# Patient Record
Sex: Female | Born: 1995 | Race: Black or African American | Hispanic: No | Marital: Single | Smoking: Former smoker
Health system: Southern US, Community
[De-identification: ages and names within clinical notes are randomized; demographics above are authoritative.]

## PROBLEM LIST (undated history)

## (undated) ENCOUNTER — Inpatient Hospital Stay (HOSPITAL_COMMUNITY): Payer: Self-pay

## (undated) DIAGNOSIS — D649 Anemia, unspecified: Secondary | ICD-10-CM

## (undated) DIAGNOSIS — D509 Iron deficiency anemia, unspecified: Secondary | ICD-10-CM

## (undated) DIAGNOSIS — Z8619 Personal history of other infectious and parasitic diseases: Secondary | ICD-10-CM

## (undated) DIAGNOSIS — R519 Headache, unspecified: Secondary | ICD-10-CM

## (undated) DIAGNOSIS — K08409 Partial loss of teeth, unspecified cause, unspecified class: Secondary | ICD-10-CM

## (undated) DIAGNOSIS — D62 Acute posthemorrhagic anemia: Secondary | ICD-10-CM

## (undated) DIAGNOSIS — Z87891 Personal history of nicotine dependence: Secondary | ICD-10-CM

## (undated) DIAGNOSIS — R51 Headache: Secondary | ICD-10-CM

## (undated) HISTORY — PX: WISDOM TOOTH EXTRACTION: SHX21

---
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
## 2013-08-03 ENCOUNTER — Emergency Department (HOSPITAL_COMMUNITY)
Admission: EM | Admit: 2013-08-03 | Discharge: 2013-08-03 | Disposition: A | Discharge status: Home / Self Care | Payer: Medicaid - Out of State | Attending: Emergency Medicine | Admitting: Emergency Medicine

## 2013-08-03 ENCOUNTER — Encounter (HOSPITAL_COMMUNITY): Payer: Self-pay | Admitting: Emergency Medicine

## 2013-08-03 ENCOUNTER — Emergency Department (HOSPITAL_COMMUNITY): Payer: Medicaid - Out of State

## 2013-08-03 DIAGNOSIS — N739 Female pelvic inflammatory disease, unspecified: Secondary | ICD-10-CM

## 2013-08-03 DIAGNOSIS — Z3202 Encounter for pregnancy test, result negative: Secondary | ICD-10-CM | POA: Insufficient documentation

## 2013-08-03 DIAGNOSIS — Z791 Long term (current) use of non-steroidal anti-inflammatories (NSAID): Secondary | ICD-10-CM | POA: Insufficient documentation

## 2013-08-03 LAB — URINALYSIS, ROUTINE W REFLEX MICROSCOPIC
Glucose, UA: NEGATIVE mg/dL
Hgb urine dipstick: NEGATIVE
Protein, ur: NEGATIVE mg/dL
Specific Gravity, Urine: 1.024 (ref 1.005–1.030)
pH: 7 (ref 5.0–8.0)

## 2013-08-03 LAB — CBC WITH DIFFERENTIAL/PLATELET
Basophils Absolute: 0 10*3/uL (ref 0.0–0.1)
Basophils Relative: 1 % (ref 0–1)
Eosinophils Absolute: 0 10*3/uL (ref 0.0–0.7)
Eosinophils Relative: 1 % (ref 0–5)
HCT: 36.8 % (ref 36.0–46.0)
Hemoglobin: 11.9 g/dL — ABNORMAL LOW (ref 12.0–15.0)
MCH: 28.9 pg (ref 26.0–34.0)
MCHC: 32.3 g/dL (ref 30.0–36.0)
Monocytes Relative: 7 % (ref 3–12)
Neutro Abs: 2 10*3/uL (ref 1.7–7.7)
Neutrophils Relative %: 43 % (ref 43–77)
RDW: 13.2 % (ref 11.5–15.5)
WBC: 4.6 10*3/uL (ref 4.0–10.5)

## 2013-08-03 LAB — COMPREHENSIVE METABOLIC PANEL
AST: 18 U/L (ref 0–37)
Albumin: 3.9 g/dL (ref 3.5–5.2)
Chloride: 104 mEq/L (ref 96–112)
Creatinine, Ser: 0.6 mg/dL (ref 0.50–1.10)
GFR calc Af Amer: >90 mL/min (ref >90)
Glucose, Bld: 115 mg/dL — ABNORMAL HIGH (ref 70–99)
Potassium: 4 mEq/L (ref 3.5–5.1)
Total Bilirubin: 0.6 mg/dL (ref 0.3–1.2)
Total Protein: 7.4 g/dL (ref 6.0–8.3)

## 2013-08-03 LAB — WET PREP, GENITAL
Trich, Wet Prep: NONE SEEN
Yeast Wet Prep HPF POC: NONE SEEN

## 2013-08-03 LAB — POCT PREGNANCY, URINE: Preg Test, Ur: NEGATIVE

## 2013-08-03 MED ORDER — DOXYCYCLINE HYCLATE 100 MG PO TABS
100.0000 mg | ORAL_TABLET | Freq: Once | ORAL | Status: AC
  Administered 2013-08-03: 100 mg via ORAL
  Filled 2013-08-03: qty 1

## 2013-08-03 MED ORDER — METRONIDAZOLE 500 MG PO TABS: 500.0000 mg | ORAL_TABLET | Freq: Two times a day (BID) | ORAL | Status: DC

## 2013-08-03 MED ORDER — CEFTRIAXONE SODIUM 250 MG IJ SOLR
250.0000 mg | Freq: Once | INTRAMUSCULAR | Status: AC
  Administered 2013-08-03: 250 mg via INTRAMUSCULAR
  Filled 2013-08-03: qty 250

## 2013-08-03 MED ORDER — DOXYCYCLINE HYCLATE 100 MG PO CAPS: 100.0000 mg | ORAL_CAPSULE | Freq: Two times a day (BID) | ORAL | Status: DC

## 2013-08-03 MED ORDER — ONDANSETRON 4 MG PO TBDP
4.0000 mg | ORAL_TABLET | Freq: Once | ORAL | Status: AC
  Administered 2013-08-03: 4 mg via ORAL
  Filled 2013-08-03: qty 1

## 2013-08-03 MED ORDER — BUTALBITAL-APAP-CAFFEINE 50-325-40 MG PO TABS: 1.0000 | ORAL_TABLET | ORAL | Status: DC | PRN

## 2013-08-03 MED ORDER — METRONIDAZOLE 500 MG PO TABS
500.0000 mg | ORAL_TABLET | Freq: Once | ORAL | Status: AC
  Administered 2013-08-03: 500 mg via ORAL
  Filled 2013-08-03: qty 1

## 2013-08-03 MED ORDER — IBUPROFEN 800 MG PO TABS
800.0000 mg | ORAL_TABLET | Freq: Once | ORAL | Status: AC
  Administered 2013-08-03: 800 mg via ORAL
  Filled 2013-08-03: qty 1

## 2013-08-03 NOTE — ED Provider Notes (Signed)
CSN: [id number]     Arrival date & time 08/03/13  1433 History   First MD Initiated Contact with Patient 08/03/13 1612     Chief Complaint  Patient presents with  . Pelvic Pain   (Consider location/radiation/quality/duration/timing/severity/associated sxs/prior Treatment) HPI Comments: 17 year old female presents with one week of waxing and waning lower abdominal pain. Is worst in the left lower quadrant. She states started after her most recent menstrual cycle. Should not take anything for the pain. She feels nauseous and vomited once last night. He has not had any urinary symptoms. She denied vaginal bleeding. She is having more white discharge. She is having protected sex with her boyfriend who she believes does not have an STD.   History reviewed. No pertinent past medical history. History reviewed. No pertinent past surgical history. No family history on file. History  Substance Use Topics  . Smoking status: Never Smoker   . Smokeless tobacco: Never Used  . Alcohol Use: Yes   OB History   Grav Para Term Preterm Abortions TAB SAB Ect Mult Living                 Review of Systems  Constitutional: Negative for fever.  Gastrointestinal: Positive for nausea.  Genitourinary: Positive for vaginal discharge. Negative for dysuria and menstrual problem.  All other systems reviewed and are negative.    Allergies  Review of patient's allergies indicates not on file.  Home Medications   Current Outpatient Rx  Name  Route  Sig  Dispense  Refill  . naproxen (NAPROSYN) 250 MG tablet   Oral   Take 250 mg by mouth 2 (two) times daily with a meal.          BP 126/75  Pulse 76  Temp(Src) 98.1 F (36.7 C) (Oral)  Resp 20  SpO2 100%  LMP 07/27/2013 Physical Exam  Nursing note and vitals reviewed. Constitutional: She is oriented to person, place, and time. She appears well-developed and well-nourished. No distress.  HENT:  Head: Normocephalic and atraumatic.  Right Ear:  External ear normal.  Left Ear: External ear normal.  Nose: Nose normal.  Eyes: Right eye exhibits no discharge. Left eye exhibits no discharge.  Cardiovascular: Normal rate, regular rhythm and normal heart sounds.   Pulmonary/Chest: Effort normal and breath sounds normal.  Abdominal: Soft. There is tenderness in the left lower quadrant.  Genitourinary: Uterus is tender. Cervix exhibits motion tenderness, discharge and friability.  Neurological: She is alert and oriented to person, place, and time.  Skin: Skin is warm and dry.    ED Course  Procedures (including critical care time) Labs Review Labs Reviewed  WET PREP, GENITAL - Abnormal; Notable for the following:    Clue Cells Wet Prep HPF POC FEW (*)    WBC, Wet Prep HPF POC FEW (*)    All other components within normal limits  CBC WITH DIFFERENTIAL - Abnormal; Notable for the following:    Hemoglobin 11.9 (*)    Lymphocytes Relative 48 (*)    All other components within normal limits  COMPREHENSIVE METABOLIC PANEL - Abnormal; Notable for the following:    Glucose, Bld 115 (*)    All other components within normal limits  URINALYSIS, ROUTINE W REFLEX MICROSCOPIC - Abnormal; Notable for the following:    APPearance CLOUDY (*)    All other components within normal limits  GC/CHLAMYDIA PROBE AMP  POCT PREGNANCY, URINE   Imaging Review US Pelvis Complete  08/03/2013   CLINICAL DATA:  Left lower quadrant pain.  EXAM: TRANSABDOMINAL ULTRASOUND OF PELVIS  DOPPLER ULTRASOUND OF OVARIES  TECHNIQUE: Transabdominal ultrasound examination of the pelvis was performed including evaluation of the uterus, ovaries, adnexal regions, and pelvic cul-de-sac.  Color and duplex Doppler ultrasound was utilized to evaluate blood flow to the ovaries.  COMPARISON:  None.  FINDINGS: Uterus  Measurements: 7.9 x 4.4 x 5.8 cm. No fibroids or other mass visualized.  Endometrium  Thickness: 2.3 mm. In the fundus, there is a somewhat heterogeneous endometrial  mass measuring 23 x 32 x 39 mm, with internal flow signal on power Doppler.  Right ovary  Measurements: 43 x 24 x 20 mm. Normal color Doppler appearance with arterial and venous waveforms recorded. Normal appearance/no adnexal mass.  Left ovary  Measurements: 43 x 33 x 23 mm. Normal color Doppler appearance, with arterial and venous signals identified. Normal appearance/no adnexal mass.  Pulsed Doppler evaluation demonstrates normal low-resistance arterial and venous waveforms in both ovaries.  There is moderate pelvic fluid with low level internal echoes.  IMPRESSION: 1. 3.9 cm endometrial lesion in the uterine fundus. Gynecologic followup recommended. 2. No evidence of ovarian torsion. 3. Moderate pelvic fluid with low level internal echoes which may represent debris or blood.   Electronically Signed   By: Oley Balm M.D.   On: 08/03/2013 20:56   [place] Art/ven Flow Abd Pelv Doppler  08/03/2013   CLINICAL DATA:  Left lower quadrant pain.  EXAM: TRANSABDOMINAL ULTRASOUND OF PELVIS  DOPPLER ULTRASOUND OF OVARIES  TECHNIQUE: Transabdominal ultrasound examination of the pelvis was performed including evaluation of the uterus, ovaries, adnexal regions, and pelvic cul-de-sac.  Color and duplex Doppler ultrasound was utilized to evaluate blood flow to the ovaries.  COMPARISON:  None.  FINDINGS: Uterus  Measurements: 7.9 x 4.4 x 5.8 cm. No fibroids or other mass visualized.  Endometrium  Thickness: 2.3 mm. In the fundus, there is a somewhat heterogeneous endometrial mass measuring 23 x 32 x 39 mm, with internal flow signal on power Doppler.  Right ovary  Measurements: 43 x 24 x 20 mm. Normal color Doppler appearance with arterial and venous waveforms recorded. Normal appearance/no adnexal mass.  Left ovary  Measurements: 43 x 33 x 23 mm. Normal color Doppler appearance, with arterial and venous signals identified. Normal appearance/no adnexal mass.  Pulsed Doppler evaluation demonstrates normal low-resistance  arterial and venous waveforms in both ovaries.  There is moderate pelvic fluid with low level internal echoes.  IMPRESSION: 1. 3.9 cm endometrial lesion in the uterine fundus. Gynecologic followup recommended. 2. No evidence of ovarian torsion. 3. Moderate pelvic fluid with low level internal echoes which may represent debris or blood.   Electronically Signed   By: Oley Balm M.D.   On: 08/03/2013 20:56    EKG Interpretation   None       MDM   1. Pelvic inflammatory disease    She has a nonsurgical abdomen. She is otherwise comfortable and her pain nearly resolved with ibuprofen. He had significant cervical motion tenderness friable cervix with discharge. Do this and her ultrasound having some fluid we will treat for PID. She was given IM Rocephin and will treat with Flagyl and doxycycline outpatient. She was notified of the endometrial lesion and I recommended she followup with a gynecologist as an outpatient. I discussed return precautions with the patient.   Audree Camel, MD 08/03/13 [phone]

## 2013-08-03 NOTE — ED Notes (Signed)
Pt in restroom at this time in attempt to provide urine sample.

## 2013-08-03 NOTE — ED Notes (Signed)
Female RN present during pelvic ultrasound.

## 2013-08-03 NOTE — ED Notes (Signed)
Pt states started having pelvic pain when her menstrual cycle ended about a week ago, states was worse last night where she couldn't sleep, states cramping feeling like she's on her period but is not bleeding, denies urinary symptoms, states feels nauseous.

## 2013-08-04 LAB — GC/CHLAMYDIA PROBE AMP
CT Probe RNA: NEGATIVE
GC Probe RNA: NEGATIVE

## 2014-05-15 ENCOUNTER — Encounter (HOSPITAL_COMMUNITY): Payer: Self-pay | Admitting: Emergency Medicine

## 2014-05-15 ENCOUNTER — Emergency Department (HOSPITAL_COMMUNITY)
Admission: EM | Admit: 2014-05-15 | Discharge: 2014-05-15 | Disposition: A | Discharge status: Home / Self Care | Payer: Medicaid - Out of State | Attending: Emergency Medicine | Admitting: Emergency Medicine

## 2014-05-15 DIAGNOSIS — N938 Other specified abnormal uterine and vaginal bleeding: Secondary | ICD-10-CM | POA: Insufficient documentation

## 2014-05-15 DIAGNOSIS — Z3202 Encounter for pregnancy test, result negative: Secondary | ICD-10-CM | POA: Insufficient documentation

## 2014-05-15 DIAGNOSIS — N949 Unspecified condition associated with female genital organs and menstrual cycle: Secondary | ICD-10-CM | POA: Diagnosis not present

## 2014-05-15 DIAGNOSIS — N898 Other specified noninflammatory disorders of vagina: Secondary | ICD-10-CM | POA: Insufficient documentation

## 2014-05-15 DIAGNOSIS — F17200 Nicotine dependence, unspecified, uncomplicated: Secondary | ICD-10-CM | POA: Insufficient documentation

## 2014-05-15 LAB — URINALYSIS, ROUTINE W REFLEX MICROSCOPIC
Bilirubin Urine: NEGATIVE
Glucose, UA: NEGATIVE mg/dL
Ketones, ur: NEGATIVE mg/dL
Leukocytes, UA: NEGATIVE
Nitrite: NEGATIVE
Protein, ur: NEGATIVE mg/dL
Specific Gravity, Urine: 1.025 (ref 1.005–1.030)
Urobilinogen, UA: 0.2 mg/dL (ref 0.0–1.0)
pH: 5.5 (ref 5.0–8.0)

## 2014-05-15 LAB — WET PREP, GENITAL
Trich, Wet Prep: NONE SEEN
Yeast Wet Prep HPF POC: NONE SEEN

## 2014-05-15 LAB — URINE MICROSCOPIC-ADD ON

## 2014-05-15 LAB — PREGNANCY, URINE: Preg Test, Ur: NEGATIVE

## 2014-05-15 NOTE — ED Provider Notes (Signed)
CSN: [id number]     [id number] date & time 05/15/14  0913 History   First MD Initiated Contact with Patient 05/15/14 0935     Chief Complaint  Patient presents with  . Vaginal Bleeding      HPI Patient reports abnormal vaginal bleeding.  Her last normal menstrual period was July 22.  She's having new bleeding which is abnormal as well some lower abdominal cramping.  Small amount of vaginal discharge.  She was lightheaded last week but is no longer lightheaded.  She denies pain at this time.  No fevers or chills.  No other complaints   History reviewed. No pertinent past medical history. History reviewed. No pertinent past surgical history. No family history on file. History  Substance Use Topics  . Smoking status: Current Every Day Smoker    Types: Cigars  . Smokeless tobacco: Never Used  . Alcohol Use: Yes   OB History   Grav Para Term Preterm Abortions TAB SAB Ect Mult Living                 Review of Systems  All other systems reviewed and are negative.     Allergies  Review of patient's allergies indicates no known allergies.  Home Medications   Prior to Admission medications   Medication Sig Start Date End Date Taking? Authorizing Provider  naproxen (NAPROSYN) 250 MG tablet Take 500 mg by mouth daily as needed for mild pain (and for cramping).    Yes Historical Provider, MD   BP 105/66  Temp(Src) 98.8 F (37.1 C) (Oral)  Resp 17  Ht 5\' 4"  (1.626 m)  Wt 115 lb (52.164 kg)  BMI 19.73 kg/m2  SpO2 99%  LMP 04/26/2014 Physical Exam  Nursing note and vitals reviewed. Constitutional: She is oriented to person, place, and time. She appears well-developed and well-nourished. No distress.  HENT:  Head: Normocephalic and atraumatic.  Eyes: EOM are normal.  Neck: Normal range of motion.  Cardiovascular: Normal rate, regular rhythm and normal heart sounds.   Pulmonary/Chest: Effort normal and breath sounds normal.  Abdominal: Soft. She exhibits no distension. There  is no tenderness.  Genitourinary:  Normal external genitalia.  Normal cervix.  No cervical discharge noted.  No significant vaginal discharge.  No vaginal bleeding noted.  No cervical motion tenderness.  No adnexal masses or fullness  Musculoskeletal: Normal range of motion.  Neurological: She is alert and oriented to person, place, and time.  Skin: Skin is warm and dry.  Psychiatric: She has a normal mood and affect. Judgment normal.    ED Course  Procedures (including critical care time) Labs Review Labs Reviewed  WET PREP, GENITAL - Abnormal; Notable for the following:    Clue Cells Wet Prep HPF POC FEW (*)    WBC, Wet Prep HPF POC FEW (*)    All other components within normal limits  URINALYSIS, ROUTINE W REFLEX MICROSCOPIC - Abnormal; Notable for the following:    APPearance CLOUDY (*)    Hgb urine dipstick TRACE (*)    All other components within normal limits  GC/CHLAMYDIA PROBE AMP  PREGNANCY, URINE  URINE MICROSCOPIC-ADD ON    Imaging Review No results found.   EKG Interpretation None      MDM   Final diagnoses:  Dysfunctional uterine bleeding    Dysfunctional uterine bleeding.  Urine pregnancy negative.  Discharge home.    Lyanne CoKevin M Montgomery Favor, MD 05/15/14 1146

## 2014-05-15 NOTE — ED Notes (Signed)
Pt. Is not able to use the restroom at this time, but is aware that we need urine specimen.

## 2014-05-15 NOTE — ED Notes (Signed)
Pt states that last week she would get lightheaded and feel syncopal with pelvic pain. Then this morning she woke up with vaginal bleeding in the bed. Pt states that pain is darker red. Pt states that normally her periods she has really bad cramping but denies any pain at this time.  Pt states that she did have intercourse last night.

## 2014-05-16 LAB — GC/CHLAMYDIA PROBE AMP
CT Probe RNA: NEGATIVE
GC Probe RNA: NEGATIVE

## 2014-06-12 ENCOUNTER — Encounter (HOSPITAL_COMMUNITY): Payer: Self-pay | Admitting: Emergency Medicine

## 2014-06-12 ENCOUNTER — Emergency Department (HOSPITAL_COMMUNITY)
Admission: EM | Admit: 2014-06-12 | Discharge: 2014-06-12 | Disposition: A | Discharge status: Home / Self Care | Payer: Medicaid - Out of State | Attending: Emergency Medicine | Admitting: Emergency Medicine

## 2014-06-12 DIAGNOSIS — M542 Cervicalgia: Secondary | ICD-10-CM | POA: Insufficient documentation

## 2014-06-12 DIAGNOSIS — Z791 Long term (current) use of non-steroidal anti-inflammatories (NSAID): Secondary | ICD-10-CM | POA: Diagnosis not present

## 2014-06-12 DIAGNOSIS — M436 Torticollis: Secondary | ICD-10-CM | POA: Insufficient documentation

## 2014-06-12 DIAGNOSIS — M62838 Other muscle spasm: Secondary | ICD-10-CM | POA: Insufficient documentation

## 2014-06-12 DIAGNOSIS — F17200 Nicotine dependence, unspecified, uncomplicated: Secondary | ICD-10-CM | POA: Diagnosis not present

## 2014-06-12 DIAGNOSIS — Z79899 Other long term (current) drug therapy: Secondary | ICD-10-CM | POA: Insufficient documentation

## 2014-06-12 MED ORDER — LORAZEPAM 2 MG/ML IJ SOLN: 1.0000 mg | Freq: Once | INTRAMUSCULAR | Status: DC

## 2014-06-12 MED ORDER — NAPROXEN 500 MG PO TABS
500.0000 mg | ORAL_TABLET | Freq: Once | ORAL | Status: AC
  Administered 2014-06-12: 500 mg via ORAL
  Filled 2014-06-12: qty 1

## 2014-06-12 MED ORDER — LORAZEPAM 1 MG PO TABS: 0.5000 mg | ORAL_TABLET | Freq: Two times a day (BID) | ORAL | Status: DC | PRN

## 2014-06-12 MED ORDER — NAPROXEN 500 MG PO TABS: 500.0000 mg | ORAL_TABLET | Freq: Two times a day (BID) | ORAL | Status: DC

## 2014-06-12 MED ORDER — LORAZEPAM 1 MG PO TABS
1.0000 mg | ORAL_TABLET | Freq: Once | ORAL | Status: AC
  Administered 2014-06-12: 1 mg via ORAL
  Filled 2014-06-12: qty 1

## 2014-06-12 NOTE — ED Notes (Signed)
Pt reports neck pain x2 days that was better this am before she starting blow drying her hear. She says "I can't move my head, my neck hurts too bad". Also reports right shoulder pain that is radiating from her neck. Denies fall or injury or other symptoms.

## 2014-06-12 NOTE — Discharge Instructions (Signed)

## 2014-06-13 NOTE — ED Provider Notes (Signed)
CSN: [id number]     Arrival date & time 06/12/14  4098 History   First MD Initiated Contact with Patient 06/12/14 1024     Chief Complaint  Patient presents with  . Neck Pain     (Consider location/radiation/quality/duration/timing/severity/associated sxs/prior Treatment) HPI   Lisa Hester is a 18 y.o. female who complains of  neck pain beginning this morning. The pain is positional with movement of neck without radiation of pain down the arms. Mechanism of injury: patient turned her head rapidly, had sudden spasm, and has been unable to turn her head to the right.  Symptoms have been acute since that time. Prior history of neck problems: no prior neck problems. There is no numbness, tingling, weakness in the arms. Denies unilateral weakness, facial asymmetry, difficulty with speech, change in gait, or vertigo.     History reviewed. No pertinent past medical history. History reviewed. No pertinent past surgical history. No family history on file. History  Substance Use Topics  . Smoking status: Current Every Day Smoker    Types: Cigars  . Smokeless tobacco: Never Used  . Alcohol Use: Yes   OB History   Grav Para Term Preterm Abortions TAB SAB Ect Mult Living                 Review of Systems  Ten systems reviewed and are negative for acute change, except as noted in the HPI.    Allergies  Review of patient's allergies indicates no known allergies.  Home Medications   Prior to Admission medications   Medication Sig Start Date End Date Taking? Authorizing Provider  methocarbamol (ROBAXIN) 500 MG tablet Take 500 mg by mouth 2 (two) times daily.   Yes Historical Provider, MD  LORazepam (ATIVAN) 1 MG tablet Take 0.5 tablets (0.5 mg total) by mouth 2 (two) times daily as needed (for muscle spasm). 06/12/14   Arthor Captain, PA-C  naproxen (NAPROSYN) 500 MG tablet Take 1 tablet (500 mg total) by mouth 2 (two) times daily with a meal. 06/12/14   Clarine Elrod, PA-C   BP 105/80   Pulse 90  Temp(Src) 99.1 F (37.3 C) (Oral)  Resp 18  Wt 115 lb (52.164 kg)  SpO2 100%  LMP 05/27/2014 Physical Exam  Constitutional: She is oriented to person, place, and time. She appears well-developed and well-nourished. No distress.  HENT:  Head: Normocephalic and atraumatic.  Eyes: Conjunctivae are normal. No scleral icterus.  Neck: Normal range of motion.  Cardiovascular: Normal rate, regular rhythm and normal heart sounds.  Exam reveals no gallop and no friction rub.   No murmur heard. Pulmonary/Chest: Effort normal and breath sounds normal. No respiratory distress.  Abdominal: Soft. Bowel sounds are normal. She exhibits no distension and no mass. There is no tenderness. There is no guarding.  Musculoskeletal:  Patient with pain and spasm on the right side. She is exquisitely TTP on the Right in the occiput, R trapezius. She is guarding and unable to turn her head past midline to the R. No weakness in the extremties  Neurological: She is alert and oriented to person, place, and time.  Skin: Skin is warm and dry. She is not diaphoretic.    ED Course  Procedures (including critical care time) Labs Review Labs Reviewed - No data to display  Imaging Review No results found.   EKG Interpretation None      MDM   Final diagnoses:  Neck muscle spasm   Acute torticollis of the neck. She will  be given pain meds. encourage  Supportive care with moist heat applications.  The patient appears reasonably screened and/or stabilized for discharge and I doubt any other medical condition or other [facility] requiring further screening, evaluation, or treatment in the ED at this time prior to discharge.     Arthor Captain, PA-C 06/16/14 2013

## 2014-06-16 NOTE — ED Provider Notes (Signed)
Medical screening examination/treatment/procedure(s) were performed by non-physician practitioner and as supervising physician I was immediately available for consultation/collaboration.   EKG Interpretation None      Devoria Albe, MD, Armando Gang   Ward Givens, MD 06/16/14 2053

## 2014-09-18 ENCOUNTER — Encounter: Payer: Self-pay | Admitting: *Deleted

## 2014-09-18 ENCOUNTER — Ambulatory Visit (INDEPENDENT_AMBULATORY_CARE_PROVIDER_SITE_OTHER): Payer: Medicaid Other | Admitting: *Deleted

## 2014-09-18 ENCOUNTER — Other Ambulatory Visit: Payer: Self-pay | Admitting: Obstetrics and Gynecology

## 2014-09-18 VITALS — BP 103/78 | HR 91 | Temp 98.6°F | Ht 64.0 in | Wt 114.9 lb

## 2014-09-18 DIAGNOSIS — Z3491 Encounter for supervision of normal pregnancy, unspecified, first trimester: Secondary | ICD-10-CM

## 2014-09-18 DIAGNOSIS — N926 Irregular menstruation, unspecified: Secondary | ICD-10-CM

## 2014-09-18 LAB — POCT PREGNANCY, URINE: Preg Test, Ur: POSITIVE — AB

## 2014-09-18 NOTE — Progress Notes (Signed)
Pregnancy test: Positive, viability scan ordered/scheduled on 10/09/14 @ 115 pm. Prenatal labs done, teaching information given. Pregnancy verification letter given.

## 2014-09-19 LAB — PRENATAL PROFILE (SOLSTAS)
ANTIBODY SCREEN: NEGATIVE
Basophils Absolute: 0 10*3/uL (ref 0.0–0.1)
Basophils Relative: 0 % (ref 0–1)
EOS ABS: 0 10*3/uL (ref 0.0–0.7)
EOS PCT: 1 % (ref 0–5)
HCT: 34.1 % — ABNORMAL LOW (ref 36.0–46.0)
HIV 1&2 Ab, 4th Generation: NONREACTIVE
Hemoglobin: 11.1 g/dL — ABNORMAL LOW (ref 12.0–15.0)
Hepatitis B Surface Ag: NEGATIVE
LYMPHS PCT: 50 % — AB (ref 12–46)
Lymphs Abs: 2.4 10*3/uL (ref 0.7–4.0)
MCH: 27.7 pg (ref 26.0–34.0)
MCHC: 32.6 g/dL (ref 30.0–36.0)
MCV: 85 fL (ref 78.0–100.0)
MPV: 9.5 fL (ref 9.4–12.4)
Monocytes Absolute: 0.3 10*3/uL (ref 0.1–1.0)
Monocytes Relative: 7 % (ref 3–12)
Neutro Abs: 2 10*3/uL (ref 1.7–7.7)
Neutrophils Relative %: 42 % — ABNORMAL LOW (ref 43–77)
Platelets: 351 10*3/uL (ref 150–400)
RBC: 4.01 MIL/uL (ref 3.87–5.11)
RDW: 15.9 % — ABNORMAL HIGH (ref 11.5–15.5)
Rh Type: POSITIVE
Rubella: 2.67 Index — ABNORMAL HIGH (ref <0.90)
WBC: 4.7 10*3/uL (ref 4.0–10.5)

## 2014-09-19 LAB — GC/CHLAMYDIA PROBE AMP
CT Probe RNA: NEGATIVE
GC Probe RNA: NEGATIVE

## 2014-09-20 LAB — HEMOGLOBINOPATHY EVALUATION
Hemoglobin Other: 0 %
Hgb A2 Quant: 2.6 % (ref 2.2–3.2)
Hgb A: 97.4 % (ref 96.8–97.8)
Hgb F Quant: 0 % (ref 0.0–2.0)
Hgb S Quant: 0 %

## 2014-09-20 LAB — CULTURE, OB URINE
COLONY COUNT: NO GROWTH
Organism ID, Bacteria: NO GROWTH

## 2014-09-22 LAB — CANNABANOIDS (GC/LC/MS), URINE: THC-COOH UR CONFIRM: 174 ng/mL — AB (ref <5)

## 2014-09-25 ENCOUNTER — Emergency Department (HOSPITAL_COMMUNITY): Payer: No Typology Code available for payment source

## 2014-09-25 ENCOUNTER — Emergency Department (HOSPITAL_COMMUNITY)
Admission: EM | Admit: 2014-09-25 | Discharge: 2014-09-25 | Disposition: A | Discharge status: Home / Self Care | Payer: No Typology Code available for payment source | Attending: Emergency Medicine | Admitting: Emergency Medicine

## 2014-09-25 DIAGNOSIS — Y92410 Unspecified street and highway as the place of occurrence of the external cause: Secondary | ICD-10-CM | POA: Diagnosis not present

## 2014-09-25 DIAGNOSIS — S3991XA Unspecified injury of abdomen, initial encounter: Secondary | ICD-10-CM | POA: Insufficient documentation

## 2014-09-25 DIAGNOSIS — R109 Unspecified abdominal pain: Secondary | ICD-10-CM

## 2014-09-25 DIAGNOSIS — Y9389 Activity, other specified: Secondary | ICD-10-CM | POA: Diagnosis not present

## 2014-09-25 DIAGNOSIS — O9A211 Injury, poisoning and certain other consequences of external causes complicating pregnancy, first trimester: Secondary | ICD-10-CM | POA: Insufficient documentation

## 2014-09-25 DIAGNOSIS — R110 Nausea: Secondary | ICD-10-CM | POA: Insufficient documentation

## 2014-09-25 DIAGNOSIS — S3992XA Unspecified injury of lower back, initial encounter: Secondary | ICD-10-CM | POA: Insufficient documentation

## 2014-09-25 DIAGNOSIS — Z87891 Personal history of nicotine dependence: Secondary | ICD-10-CM | POA: Insufficient documentation

## 2014-09-25 DIAGNOSIS — Y998 Other external cause status: Secondary | ICD-10-CM | POA: Insufficient documentation

## 2014-09-25 DIAGNOSIS — O26899 Other specified pregnancy related conditions, unspecified trimester: Secondary | ICD-10-CM

## 2014-09-25 DIAGNOSIS — O9989 Other specified diseases and conditions complicating pregnancy, childbirth and the puerperium: Secondary | ICD-10-CM | POA: Insufficient documentation

## 2014-09-25 DIAGNOSIS — Z3A01 Less than 8 weeks gestation of pregnancy: Secondary | ICD-10-CM | POA: Insufficient documentation

## 2014-09-25 LAB — PRESCRIPTION MONITORING PROFILE (19 PANEL)
Amphetamine/Meth: NEGATIVE ng/mL
BUPRENORPHINE, URINE: NEGATIVE ng/mL
Barbiturate Screen, Urine: NEGATIVE ng/mL
Benzodiazepine Screen, Urine: NEGATIVE ng/mL
CARISOPRODOL, URINE: NEGATIVE ng/mL
Cocaine Metabolites: NEGATIVE ng/mL
Creatinine, Urine: 234.04 mg/dL (ref >20.0)
ECSTASY: NEGATIVE ng/mL
Fentanyl, Ur: NEGATIVE ng/mL
MEPERIDINE UR: NEGATIVE ng/mL
METHADONE SCREEN, URINE: NEGATIVE ng/mL
METHAQUALONE SCREEN (URINE): NEGATIVE ng/mL
NITRITES URINE, INITIAL: NEGATIVE ug/mL
OXYCODONE SCRN UR: NEGATIVE ng/mL
Opiate Screen, Urine: NEGATIVE ng/mL
PH URINE, INITIAL: 5.8 pH (ref 4.5–8.9)
PROPOXYPHENE: NEGATIVE ng/mL
Phencyclidine, Ur: NEGATIVE ng/mL
TRAMADOL UR: NEGATIVE ng/mL
Tapentadol, urine: NEGATIVE ng/mL
Zolpidem, Urine: NEGATIVE ng/mL

## 2014-09-25 LAB — URINALYSIS, ROUTINE W REFLEX MICROSCOPIC
BILIRUBIN URINE: NEGATIVE
Glucose, UA: NEGATIVE mg/dL
Hgb urine dipstick: NEGATIVE
Ketones, ur: NEGATIVE mg/dL
Leukocytes, UA: NEGATIVE
Nitrite: NEGATIVE
PH: 6 (ref 5.0–8.0)
Protein, ur: NEGATIVE mg/dL
SPECIFIC GRAVITY, URINE: 1.026 (ref 1.005–1.030)
Urobilinogen, UA: 1 mg/dL (ref 0.0–1.0)

## 2014-09-25 LAB — HCG, QUANTITATIVE, PREGNANCY: hCG, Beta Chain, Quant, S: 17562 m[IU]/mL — ABNORMAL HIGH (ref <5)

## 2014-09-25 NOTE — Discharge Instructions (Signed)
Read the information below.  You may return to the Emergency Department at any time for worsening condition or any new symptoms that concern you.  If you develop worsening abdominal pain, uncontrolled bleeding contact your ObGyn or [facility].    Abdominal Pain During Pregnancy Abdominal pain is common in pregnancy. Most of the time, it does not cause harm. There are many causes of abdominal pain. Some causes are more serious than others. Some of the causes of abdominal pain in pregnancy are easily diagnosed. Occasionally, the diagnosis takes time to understand. Other times, the cause is not determined. Abdominal pain can be a sign that something is very wrong with the pregnancy, or the pain may have nothing to do with the pregnancy at all. For this reason, always tell your health care provider if you have any abdominal discomfort. HOME CARE INSTRUCTIONS  Monitor your abdominal pain for any changes. The following actions may help to alleviate any discomfort you are experiencing:  Do not have sexual intercourse or put anything in your vagina until your symptoms go away completely.  Get plenty of rest until your pain improves.  Drink clear fluids if you feel nauseous. Avoid solid food as long as you are uncomfortable or nauseous.  Only take over-the-counter or prescription medicine as directed by your health care provider.  Keep all follow-up appointments with your health care provider. SEEK IMMEDIATE MEDICAL CARE IF:  You are bleeding, leaking fluid, or passing tissue from the vagina.  You have increasing pain or cramping.  You have persistent vomiting.  You have painful or bloody urination.  You have a fever.  You notice a decrease in your baby's movements.  You have extreme weakness or feel faint.  You have shortness of breath, with or without abdominal pain.  You develop a severe headache with abdominal pain.  You have abnormal vaginal  discharge with abdominal pain.  You have persistent diarrhea.  You have abdominal pain that continues even after rest, or gets worse. MAKE SURE YOU:   Understand these instructions.  Will watch your condition.  Will get help right away if you are not doing well or get worse. Document Released: 09/21/2005 Document Revised: 07/12/2013 Document Reviewed: 04/20/2013 [facility] Patient Information 2015 [place], [place]. This information is not intended to replace advice given to you by your health care provider. Make sure you discuss any questions you have with your health care provider.
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
## 2014-10-05 NOTE — L&D Delivery Note (Signed)
Delivery Note At 1:21 PM a viable and healthy female was delivered via Vaginal, Spontaneous Delivery (Presentation: ; Occiput Anterior).  APGAR: 8, 9; weight pending .   Placenta status: Intact, Spontaneous.  Cord: 3 vessels with the following complications: None.  Cord pH: na. Mild shoulder dystocia relieved by episiotomy and McRoberts Maneuver.   Anesthesia: Epidural  Episiotomy: Median due to mild shoulder dystocia Lacerations: periclitoral  Suture Repair: 2.0 3.0 vicryl rapide Est. Blood Loss (mL):  300  Mom to postpartum.  Baby to Couplet care / Skin to Skin.  Jamariah Tony J 05/06/2015, 1:43 PM

## 2014-10-09 ENCOUNTER — Ambulatory Visit (HOSPITAL_COMMUNITY)
Admission: RE | Admit: 2014-10-09 | Discharge: 2014-10-09 | Disposition: A | Discharge status: Home / Self Care | Payer: Medicaid Other | Source: Ambulatory Visit | Attending: Obstetrics and Gynecology | Admitting: Obstetrics and Gynecology

## 2014-10-09 DIAGNOSIS — Z3A01 Less than 8 weeks gestation of pregnancy: Secondary | ICD-10-CM | POA: Insufficient documentation

## 2014-10-09 DIAGNOSIS — N8320 Unspecified ovarian cysts: Secondary | ICD-10-CM | POA: Insufficient documentation

## 2014-10-09 DIAGNOSIS — O3481 Maternal care for other abnormalities of pelvic organs, first trimester: Secondary | ICD-10-CM | POA: Insufficient documentation

## 2014-10-09 DIAGNOSIS — Z36 Encounter for antenatal screening of mother: Secondary | ICD-10-CM | POA: Insufficient documentation

## 2014-10-09 DIAGNOSIS — N926 Irregular menstruation, unspecified: Secondary | ICD-10-CM

## 2014-10-11 ENCOUNTER — Encounter: Payer: Medicaid - Out of State | Admitting: Obstetrics and Gynecology

## 2014-10-25 ENCOUNTER — Ambulatory Visit (INDEPENDENT_AMBULATORY_CARE_PROVIDER_SITE_OTHER): Payer: Medicaid Other | Admitting: Physician Assistant

## 2014-10-25 ENCOUNTER — Telehealth: Payer: Self-pay | Admitting: *Deleted

## 2014-10-25 ENCOUNTER — Encounter: Payer: Self-pay | Admitting: Physician Assistant

## 2014-10-25 ENCOUNTER — Other Ambulatory Visit: Payer: Self-pay | Admitting: Physician Assistant

## 2014-10-25 VITALS — BP 116/56 | HR 98 | Temp 98.4°F | Wt 120.7 lb

## 2014-10-25 DIAGNOSIS — R519 Headache, unspecified: Secondary | ICD-10-CM | POA: Insufficient documentation

## 2014-10-25 DIAGNOSIS — O26899 Other specified pregnancy related conditions, unspecified trimester: Secondary | ICD-10-CM

## 2014-10-25 DIAGNOSIS — Z369 Encounter for antenatal screening, unspecified: Secondary | ICD-10-CM

## 2014-10-25 DIAGNOSIS — Z3401 Encounter for supervision of normal first pregnancy, first trimester: Secondary | ICD-10-CM | POA: Insufficient documentation

## 2014-10-25 DIAGNOSIS — Z3682 Encounter for antenatal screening for nuchal translucency: Secondary | ICD-10-CM

## 2014-10-25 DIAGNOSIS — R51 Headache: Secondary | ICD-10-CM

## 2014-10-25 DIAGNOSIS — Z23 Encounter for immunization: Secondary | ICD-10-CM

## 2014-10-25 LAB — POCT URINALYSIS DIP (DEVICE)
BILIRUBIN URINE: NEGATIVE
GLUCOSE, UA: NEGATIVE mg/dL
HGB URINE DIPSTICK: NEGATIVE
Nitrite: NEGATIVE
PROTEIN: NEGATIVE mg/dL
Specific Gravity, Urine: >=1.03 (ref 1.005–1.030)
Urobilinogen, UA: 1 mg/dL (ref 0.0–1.0)
pH: 6 (ref 5.0–8.0)

## 2014-10-25 NOTE — Progress Notes (Signed)
Initial OB visit, Flu vaccine

## 2014-10-25 NOTE — Telephone Encounter (Signed)
Contacted patient to inquire if she would like genetic testing at the request of Nada MaclachlanKaren Teague-Clark.  Pt does desire genetic testing.  Appointment with MFM on 11/14/14 @ 1:15.  Pt notified. Pt verbalizes understanding.

## 2014-10-25 NOTE — Addendum Note (Signed)
Addended by: Glyn AdeEAGUE CLARK, Clydie BraunKAREN E on: 10/25/2014 04:24 PM   Modules accepted: Orders

## 2014-10-25 NOTE — Patient Instructions (Signed)
First Trimester of Pregnancy The first trimester of pregnancy is from week 1 until the end of week 12 (months 1 through 3). A week after a sperm fertilizes an egg, the egg will implant on the wall of the uterus. This embryo will begin to develop into a baby. Genes from you and your partner are forming the baby. The female genes determine whether the baby is a boy or a girl. At 6-8 weeks, the eyes and face are formed, and the heartbeat can be seen on ultrasound. At the end of 12 weeks, all the baby's organs are formed.  Now that you are pregnant, you will want to do everything you can to have a healthy baby. Two of the most important things are to get good prenatal care and to follow your health care provider's instructions. Prenatal care is all the medical care you receive before the baby's birth. This care will help prevent, find, and treat any problems during the pregnancy and childbirth. BODY CHANGES Your body goes through many changes during pregnancy. The changes vary from woman to woman.   You may gain or lose a couple of pounds at first.  You may feel sick to your stomach (nauseous) and throw up (vomit). If the vomiting is uncontrollable, call your health care provider.  You may tire easily.  You may develop headaches that can be relieved by medicines approved by your health care provider.  You may urinate more often. Painful urination may mean you have a bladder infection.  You may develop heartburn as a result of your pregnancy.  You may develop constipation because certain hormones are causing the muscles that push waste through your intestines to slow down.  You may develop hemorrhoids or swollen, bulging veins (varicose veins).  Your breasts may begin to grow larger and become tender. Your nipples may stick out more, and the tissue that surrounds them (areola) may become darker.  Your gums may bleed and may be sensitive to brushing and flossing.  Dark spots or blotches (chloasma,  mask of pregnancy) may develop on your face. This will likely fade after the baby is born.  Your menstrual periods will stop.  You may have a loss of appetite.  You may develop cravings for certain kinds of food.  You may have changes in your emotions from day to day, such as being excited to be pregnant or being concerned that something may go wrong with the pregnancy and baby.  You may have more vivid and strange dreams.  You may have changes in your hair. These can include thickening of your hair, rapid growth, and changes in texture. Some women also have hair loss during or after pregnancy, or hair that feels dry or thin. Your hair will most likely return to normal after your baby is born. WHAT TO EXPECT AT YOUR PRENATAL VISITS During a routine prenatal visit:  You will be weighed to make sure you and the baby are growing normally.  Your blood pressure will be taken.  Your abdomen will be measured to track your baby's growth.  The fetal heartbeat will be listened to starting around week 10 or 12 of your pregnancy.  Test results from any previous visits will be discussed. Your health care provider may ask you:  How you are feeling.  If you are feeling the baby move.  If you have had any abnormal symptoms, such as leaking fluid, bleeding, severe headaches, or abdominal cramping.  If you have any questions. Other tests   that may be performed during your first trimester include:  Blood tests to find your blood type and to check for the presence of any previous infections. They will also be used to check for low iron levels (anemia) and Rh antibodies. Later in the pregnancy, blood tests for diabetes will be done along with other tests if problems develop.  Urine tests to check for infections, diabetes, or protein in the urine.  An ultrasound to confirm the proper growth and development of the baby.  An amniocentesis to check for possible genetic problems.  Fetal screens for  spina bifida and Down syndrome.  You may need other tests to make sure you and the baby are doing well. HOME CARE INSTRUCTIONS  Medicines  Follow your health care provider's instructions regarding medicine use. Specific medicines may be either safe or unsafe to take during pregnancy.  Take your prenatal vitamins as directed.  If you develop constipation, try taking a stool softener if your health care provider approves. Diet  Eat regular, well-balanced meals. Choose a variety of foods, such as meat or vegetable-based protein, fish, milk and low-fat dairy products, vegetables, fruits, and whole grain breads and cereals. Your health care provider will help you determine the amount of weight gain that is right for you.  Avoid raw meat and uncooked cheese. These carry germs that can cause birth defects in the baby.  Eating four or five small meals rather than three large meals a day may help relieve nausea and vomiting. If you start to feel nauseous, eating a few soda crackers can be helpful. Drinking liquids between meals instead of during meals also seems to help nausea and vomiting.  If you develop constipation, eat more high-fiber foods, such as fresh vegetables or fruit and whole grains. Drink enough fluids to keep your urine clear or pale yellow. Activity and Exercise  Exercise only as directed by your health care provider. Exercising will help you:  Control your weight.  Stay in shape.  Be prepared for labor and delivery.  Experiencing pain or cramping in the lower abdomen or low back is a good sign that you should stop exercising. Check with your health care provider before continuing normal exercises.  Try to avoid standing for long periods of time. Move your legs often if you must stand in one place for a long time.  Avoid heavy lifting.  Wear low-heeled shoes, and practice good posture.  You may continue to have sex unless your health care provider directs you  otherwise. Relief of Pain or Discomfort  Wear a good support bra for breast tenderness.   Take warm sitz baths to soothe any pain or discomfort caused by hemorrhoids. Use hemorrhoid cream if your health care provider approves.   Rest with your legs elevated if you have leg cramps or low back pain.  If you develop varicose veins in your legs, wear support hose. Elevate your feet for 15 minutes, 3-4 times a day. Limit salt in your diet. Prenatal Care  Schedule your prenatal visits by the twelfth week of pregnancy. They are usually scheduled monthly at first, then more often in the last 2 months before delivery.  Write down your questions. Take them to your prenatal visits.  Keep all your prenatal visits as directed by your health care provider. Safety  Wear your seat belt at all times when driving.  Make a list of emergency phone numbers, including numbers for family, friends, the hospital, and police and fire departments. General Tips    Ask your health care provider for a referral to a local prenatal education class. Begin classes no later than at the beginning of month 6 of your pregnancy.  Ask for help if you have counseling or nutritional needs during pregnancy. Your health care provider can offer advice or refer you to specialists for help with various needs.  Do not use hot tubs, steam rooms, or saunas.  Do not douche or use tampons or scented sanitary pads.  Do not cross your legs for long periods of time.  Avoid cat litter boxes and soil used by cats. These carry germs that can cause birth defects in the baby and possibly loss of the fetus by miscarriage or stillbirth.  Avoid all smoking, herbs, alcohol, and medicines not prescribed by your health care provider. Chemicals in these affect the formation and growth of the baby.  Schedule a dentist appointment. At home, brush your teeth with a soft toothbrush and be gentle when you floss. SEEK MEDICAL CARE IF:   You have  dizziness.  You have mild pelvic cramps, pelvic pressure, or nagging pain in the abdominal area.  You have persistent nausea, vomiting, or diarrhea.  You have a bad smelling vaginal discharge.  You have pain with urination.  You notice increased swelling in your face, hands, legs, or ankles. SEEK IMMEDIATE MEDICAL CARE IF:   You have a fever.  You are leaking fluid from your vagina.  You have spotting or bleeding from your vagina.  You have severe abdominal cramping or pain.  You have rapid weight gain or loss.  You vomit blood or material that looks like coffee grounds.  You are exposed to German measles and have never had them.  You are exposed to fifth disease or chickenpox.  You develop a severe headache.  You have shortness of breath.  You have any kind of trauma, such as from a fall or a car accident. Document Released: 09/15/2001 Document Revised: 02/05/2014 Document Reviewed: 08/01/2013 ExitCare Patient Information 2015 [employer]. This information is not intended to replace advice given to you by your health care provider. Make sure you discuss any questions you have with your health care provider.  

## 2014-10-25 NOTE — Progress Notes (Signed)
  Subjective:    Lisa Hester is a G1P0 634w1d being seen today for her first obstetrical visit.    Patient does intend to breast feed. Pregnancy history fully reviewed.  Patient reports headache.  Severe intensity, worse on right, associated with blurry vision, phonophobia, worsening with movement.  Not associated with N/V, photophobia.  No treatment tried.    Filed Vitals:   10/25/14 1420  BP: 116/56  Pulse: 98  Temp: 98.4 F (36.9 C)  Weight: 120 lb 11.2 oz (54.749 kg)    HISTORY: OB History  Gravida Para Term Preterm AB SAB TAB Ectopic Multiple Living  1             # Outcome Date GA Lbr Len/2nd Weight Sex Delivery Anes PTL Lv  1 Current              History reviewed. No pertinent past medical history. History reviewed. No pertinent past surgical history. History reviewed. No pertinent family history.   Exam    Uterus:   gravid  Pelvic Exam:    Perineum: Normal Perineum   Vulva: normal           Cervix: no cervical motion tenderness   Adnexa: left adnexal tenderness   Bony Pelvis: average  System: Breast:     Skin: normal coloration and turgor, no rashes    Neurologic: oriented, normal mood   Extremities: normal strength, tone, and muscle mass   HEENT extra ocular movement intact   Mouth/Teeth mucous membranes moist, pharynx normal without lesions and dental hygiene good   Neck supple   Cardiovascular: regular rate and rhythm   Respiratory:  appears well, vitals normal, no respiratory distress, acyanotic, normal RR, ear and throat exam is normal, neck free of mass or lymphadenopathy, chest clear, no wheezing, crepitations, rhonchi, normal symmetric air entry   Abdomen: soft, non-tender; bowel sounds normal; no masses,  no organomegaly   Urinary: urethral meatus normal      Assessment:    Pregnancy: G1P0 There are no active problems to display for this patient. 10 week IUP Headache      Plan:    Flu shot today. Initial labs previously  drawn. Prenatal vitamins. Problem list reviewed and updated. Genetic Screening - will discuss with nurse  Ultrasound discussed; fetal survey: requested.  Follow up in 4 weeks. 50% of 30 min visit spent on counseling and coordination of care.  Tylenol for HA.  Call clinic if worsening of HA   Bertram Denvereague Clark, Karen E 10/25/2014

## 2014-10-25 NOTE — Progress Notes (Signed)
Pt desires First Trimester Screen.  Ordered with MFM.

## 2014-10-26 LAB — GC/CHLAMYDIA PROBE AMP
CT Probe RNA: NEGATIVE
GC PROBE AMP APTIMA: NEGATIVE

## 2014-11-12 ENCOUNTER — Inpatient Hospital Stay (HOSPITAL_COMMUNITY)
Admission: AD | Admit: 2014-11-12 | Discharge: 2014-11-12 | Disposition: A | Discharge status: Home / Self Care | Payer: Medicaid Other | Source: Ambulatory Visit | Attending: Obstetrics & Gynecology | Admitting: Obstetrics & Gynecology

## 2014-11-12 ENCOUNTER — Encounter (HOSPITAL_COMMUNITY): Payer: Self-pay | Admitting: *Deleted

## 2014-11-12 DIAGNOSIS — O208 Other hemorrhage in early pregnancy: Secondary | ICD-10-CM | POA: Insufficient documentation

## 2014-11-12 DIAGNOSIS — O468X1 Other antepartum hemorrhage, first trimester: Secondary | ICD-10-CM

## 2014-11-12 DIAGNOSIS — O209 Hemorrhage in early pregnancy, unspecified: Secondary | ICD-10-CM | POA: Diagnosis present

## 2014-11-12 DIAGNOSIS — Z3A12 12 weeks gestation of pregnancy: Secondary | ICD-10-CM

## 2014-11-12 DIAGNOSIS — Z87891 Personal history of nicotine dependence: Secondary | ICD-10-CM | POA: Diagnosis not present

## 2014-11-12 DIAGNOSIS — N930 Postcoital and contact bleeding: Secondary | ICD-10-CM

## 2014-11-12 DIAGNOSIS — O418X10 Other specified disorders of amniotic fluid and membranes, first trimester, not applicable or unspecified: Secondary | ICD-10-CM

## 2014-11-12 HISTORY — DX: Headache: R51

## 2014-11-12 HISTORY — DX: Headache, unspecified: R51.9

## 2014-11-12 LAB — URINE MICROSCOPIC-ADD ON

## 2014-11-12 LAB — URINALYSIS, ROUTINE W REFLEX MICROSCOPIC
BILIRUBIN URINE: NEGATIVE
Glucose, UA: NEGATIVE mg/dL
Ketones, ur: NEGATIVE mg/dL
NITRITE: NEGATIVE
PH: 6 (ref 5.0–8.0)
PROTEIN: NEGATIVE mg/dL
Specific Gravity, Urine: 1.02 (ref 1.005–1.030)
Urobilinogen, UA: 0.2 mg/dL (ref 0.0–1.0)

## 2014-11-12 NOTE — Discharge Instructions (Signed)
Pelvic Rest °Pelvic rest is sometimes recommended for women when:  °· The placenta is partially or completely covering the opening of the cervix (placenta previa). °· There is bleeding between the uterine wall and the amniotic sac in the first trimester (subchorionic hemorrhage). °· The cervix begins to open without labor starting (incompetent cervix, cervical insufficiency). °· The labor is too early (preterm labor). °HOME CARE INSTRUCTIONS °· Do not have sexual intercourse, stimulation, or an orgasm. °· Do not use tampons, douche, or put anything in the vagina. °· Do not lift anything over 10 pounds (4.5 kg). °· Avoid strenuous activity or straining your pelvic muscles. °SEEK MEDICAL CARE IF:  °· You have any vaginal bleeding during pregnancy. Treat this as a potential emergency. °· You have cramping pain felt low in the stomach (stronger than menstrual cramps). °· You notice vaginal discharge (watery, mucus, or bloody). °· You have a low, dull backache. °· There are regular contractions or uterine tightening. °SEEK IMMEDIATE MEDICAL CARE IF: °You have vaginal bleeding and have placenta previa.  °Document Released: 01/16/2011 Document Revised: 12/14/2011 Document Reviewed: 01/16/2011 °ExitCare® Patient Information ©2015 [employer]. This information is not intended to replace advice given to you by your health care provider. Make sure you discuss any questions you have with your health care provider. ° °

## 2014-11-12 NOTE — MAU Provider Note (Signed)
History     CSN: [id number]  Arrival date and time: 11/12/14 1615   None     Chief Complaint  Patient presents with  . Vaginal Bleeding   HPI   Ms. Lisa Hester is a 19 y.o. female G1P0 at [redacted]w[redacted]d who presents with bleeding following intercourse this afternoon. The bleeding is described as very light and has for the most part stopped since her arrival. She also has a subchorionic hemorrhage that was noted on  [place].   OB History    Gravida Para Term Preterm AB TAB SAB Ectopic Multiple Living   1               Past Medical History  Diagnosis Date  . Headache     History reviewed. No pertinent past surgical history.  History reviewed. No pertinent family history.  History  Substance Use Topics  . Smoking status: Former Smoker    Types: Cigars  . Smokeless tobacco: Never Used  . Alcohol Use: No    Allergies:  Allergies  Allergen Reactions  . Ginger Hives    Prescriptions prior to admission  Medication Sig Dispense Refill Last Dose  . acetaminophen (TYLENOL) 500 MG tablet Take 500 mg by mouth every 6 (six) hours as needed (cramping).    Past Week at Unknown time  . Prenatal Vit-Fe Fumarate-FA (PRENATAL MULTIVITAMIN) TABS tablet Take 1 tablet by mouth daily at 12 noon.   Past Week at Unknown time   Results for orders placed or performed during the hospital encounter of 11/12/14 (from the past 48 hour(s))  Urinalysis, Routine w reflex microscopic     Status: Abnormal   Collection Time: 11/12/14  4:25 PM  Result Value Ref Range   Color, Urine YELLOW YELLOW   APPearance CLEAR CLEAR   Specific Gravity, Urine 1.020 1.005 - 1.030   pH 6.0 5.0 - 8.0   Glucose, UA NEGATIVE NEGATIVE mg/dL   Hgb urine dipstick SMALL (A) NEGATIVE   Bilirubin Urine NEGATIVE NEGATIVE   Ketones, ur NEGATIVE NEGATIVE mg/dL   Protein, ur NEGATIVE NEGATIVE mg/dL   Urobilinogen, UA 0.2 0.0 - 1.0 mg/dL   Nitrite NEGATIVE NEGATIVE   Leukocytes, UA TRACE (A) NEGATIVE  Urine microscopic-add on      Status: None   Collection Time: 11/12/14  4:25 PM  Result Value Ref Range   Squamous Epithelial / LPF RARE RARE   WBC, UA 0-2 <3 WBC/hpf   RBC / HPF 0-2 <3 RBC/hpf   Bacteria, UA RARE RARE    Review of Systems  Constitutional: Negative for fever and chills.  Gastrointestinal: Positive for abdominal pain (Mild abdominal cramping ). Negative for nausea, vomiting, diarrhea and constipation.  Genitourinary:       +light, pink spotting    Physical Exam   Blood pressure 117/65, pulse 103, temperature 98.5 F (36.9 C), temperature source Oral, resp. rate 18, last menstrual period 08/15/2014.  Physical Exam  Constitutional: She is oriented to person, place, and time. She appears well-developed and well-nourished. No distress.  HENT:  Head: Normocephalic.  Eyes: Pupils are equal, round, and reactive to light.  Neck: Neck supple.  Respiratory: Effort normal and breath sounds normal.  GI: Soft. Normal appearance. There is tenderness in the suprapubic area. There is no rigidity, no rebound, no guarding and no CVA tenderness.  Genitourinary:  Speculum exam: Vagina - Scant amount of creamy, pink discharge, no odor Cervix - No contact bleeding, no active bleeding  Bimanual exam: Cervix closed, no  CMT  Uterus non tender, enlarged  Adnexa non tender, no masses bilaterally Chaperone present for exam.   Musculoskeletal: Normal range of motion.  Neurological: She is alert and oriented to person, place, and time.  Skin: Skin is warm. She is not diaphoretic.  Psychiatric: Her behavior is normal.    MAU Course  Procedures  None  MDM + fetal heart tones.  A positive blood type  Patient had recent vaginal cultures collected   Assessment and Plan   A:  Post coital bleeding  Known subchorionic hemorrhage in pregnancy   P:  Discharge home in stable condition Pelvic rest Bleeding precautions Return to MAU as needed, if symptoms worsen Keep your next prenatal appointment.     Iona HansenJennifer Irene Rasch, NP  11/12/2014  8:03 PM

## 2014-11-12 NOTE — MAU Note (Signed)
States had intercourse about an hour ago and had vaginal bleeding afterwards. States she is not bleeding right now. C/O abdominal cramping.

## 2014-11-14 ENCOUNTER — Ambulatory Visit (HOSPITAL_COMMUNITY)
Admission: RE | Admit: 2014-11-14 | Discharge: 2014-11-14 | Disposition: A | Discharge status: Home / Self Care | Payer: Medicaid Other | Source: Ambulatory Visit | Attending: Physician Assistant | Admitting: Physician Assistant

## 2014-11-14 ENCOUNTER — Encounter (HOSPITAL_COMMUNITY): Payer: Self-pay

## 2014-11-14 DIAGNOSIS — Z369 Encounter for antenatal screening, unspecified: Secondary | ICD-10-CM

## 2014-11-14 DIAGNOSIS — Z3682 Encounter for antenatal screening for nuchal translucency: Secondary | ICD-10-CM | POA: Insufficient documentation

## 2014-11-14 DIAGNOSIS — Z3A13 13 weeks gestation of pregnancy: Secondary | ICD-10-CM | POA: Diagnosis not present

## 2014-11-14 DIAGNOSIS — Z36 Encounter for antenatal screening of mother: Secondary | ICD-10-CM | POA: Insufficient documentation

## 2014-11-20 ENCOUNTER — Other Ambulatory Visit (HOSPITAL_COMMUNITY): Payer: Self-pay

## 2014-11-21 ENCOUNTER — Ambulatory Visit (INDEPENDENT_AMBULATORY_CARE_PROVIDER_SITE_OTHER): Payer: Self-pay | Admitting: Advanced Practice Midwife

## 2014-11-21 VITALS — BP 108/58 | HR 100 | Wt 124.0 lb

## 2014-11-21 DIAGNOSIS — Z3401 Encounter for supervision of normal first pregnancy, first trimester: Secondary | ICD-10-CM

## 2014-11-21 LAB — POCT URINALYSIS DIP (DEVICE)
Bilirubin Urine: NEGATIVE
GLUCOSE, UA: NEGATIVE mg/dL
Hgb urine dipstick: NEGATIVE
KETONES UR: NEGATIVE mg/dL
Leukocytes, UA: NEGATIVE
Nitrite: NEGATIVE
PROTEIN: NEGATIVE mg/dL
Specific Gravity, Urine: 1.02 (ref 1.005–1.030)
Urobilinogen, UA: 0.2 mg/dL (ref 0.0–1.0)
pH: 7.5 (ref 5.0–8.0)

## 2014-11-21 NOTE — Progress Notes (Signed)
First trimester screen normal. AFP at [place]. C/O occasional LLQ cramping throughout pregnancy. No VB, discharge, urinary complaints. Per US pt has 3 cm left ovarian complex cyst. Comfort measures. Torsion precautions.

## 2014-11-21 NOTE — Patient Instructions (Addendum)
[facility]    Phone: [phone]  Second Trimester of Pregnancy The second trimester is from week 13 through week 28, months 4 through 6. The second trimester is often a time when you feel your best. Your body has also adjusted to being pregnant, and you begin to feel better physically. Usually, morning sickness has lessened or quit completely, you may have more energy, and you may have an increase in appetite. The second trimester is also a time when the fetus is growing rapidly. At the end of the sixth month, the fetus is about 9 inches long and weighs about 1 pounds. You will likely begin to feel the baby move (quickening) between 18 and 20 weeks of the pregnancy. BODY CHANGES Your body goes through many changes during pregnancy. The changes vary from woman to woman.   Your weight will continue to increase. You will notice your lower abdomen bulging out.  You may begin to get stretch marks on your hips, abdomen, and breasts.  You may develop headaches that can be relieved by medicines approved by your health care provider.  You may urinate more often because the fetus is pressing on your bladder.  You may develop or continue to have heartburn as a result of your pregnancy.  You may develop constipation because certain hormones are causing the muscles that push waste through your intestines to slow down.  You may develop hemorrhoids or swollen, bulging veins (varicose veins).  You may have back pain because of the weight gain and pregnancy hormones relaxing your joints between the bones in your pelvis and as a result of a shift in weight and the muscles that support your balance.  Your breasts will continue to grow and be tender.  Your gums may bleed and may be sensitive to brushing and flossing.  Dark spots or blotches (chloasma, mask of pregnancy) may develop on your face. This will likely fade after the baby is born.  A dark line from your belly button to  the pubic area (linea nigra) may appear. This will likely fade after the baby is born.  You may have changes in your hair. These can include thickening of your hair, rapid growth, and changes in texture. Some women also have hair loss during or after pregnancy, or hair that feels dry or thin. Your hair will most likely return to normal after your baby is born. WHAT TO EXPECT AT YOUR PRENATAL VISITS During a routine prenatal visit:  You will be weighed to make sure you and the fetus are growing normally.  Your blood pressure will be taken.  Your abdomen will be measured to track your baby's growth.  The fetal heartbeat will be listened to.  Any test results from the previous visit will be discussed. Your health care provider may ask you:  How you are feeling.  If you are feeling the baby move.  If you have had any abnormal symptoms, such as leaking fluid, bleeding, severe headaches, or abdominal cramping.  If you have any questions. Other tests that may be performed during your second trimester include:  Blood tests that check for:  Low iron levels (anemia).  Gestational diabetes (between 24 and 28 weeks).  Rh antibodies.  Urine tests to check for infections, diabetes, or protein in the urine.  An ultrasound to confirm the proper growth and development of the baby.  An amniocentesis to check for possible genetic problems.  Fetal screens for spina bifida and Down syndrome. HOME  CARE INSTRUCTIONS   Avoid all smoking, herbs, alcohol, and unprescribed drugs. These chemicals affect the formation and growth of the baby.  Follow your health care provider's instructions regarding medicine use. There are medicines that are either safe or unsafe to take during pregnancy.  Exercise only as directed by your health care provider. Experiencing uterine cramps is a good sign to stop exercising.  Continue to eat regular, healthy meals.  Wear a good support bra for breast  tenderness.  Do not use hot tubs, steam rooms, or saunas.  Wear your seat belt at all times when driving.  Avoid raw meat, uncooked cheese, cat litter boxes, and soil used by cats. These carry germs that can cause birth defects in the baby.  Take your prenatal vitamins.  Try taking a stool softener (if your health care provider approves) if you develop constipation. Eat more high-fiber foods, such as fresh vegetables or fruit and whole grains. Drink plenty of fluids to keep your urine clear or pale yellow.  Take warm sitz baths to soothe any pain or discomfort caused by hemorrhoids. Use hemorrhoid cream if your health care provider approves.  If you develop varicose veins, wear support hose. Elevate your feet for 15 minutes, 3-4 times a day. Limit salt in your diet.  Avoid heavy lifting, wear low heel shoes, and practice good posture.  Rest with your legs elevated if you have leg cramps or low back pain.  Visit your dentist if you have not gone yet during your pregnancy. Use a soft toothbrush to brush your teeth and be gentle when you floss.  A sexual relationship may be continued unless your health care provider directs you otherwise.  Continue to go to all your prenatal visits as directed by your health care provider. SEEK MEDICAL CARE IF:   You have dizziness.  You have mild pelvic cramps, pelvic pressure, or nagging pain in the abdominal area.  You have persistent nausea, vomiting, or diarrhea.  You have a bad smelling vaginal discharge.  You have pain with urination. SEEK IMMEDIATE MEDICAL CARE IF:   You have a fever.  You are leaking fluid from your vagina.  You have spotting or bleeding from your vagina.  You have severe abdominal cramping or pain.  You have rapid weight gain or loss.  You have shortness of breath with chest pain.  You notice sudden or extreme swelling of your face, hands, ankles, feet, or legs.  You have not felt your baby move in over an  hour.  You have severe headaches that do not go away with medicine.  You have vision changes. Document Released: 09/15/2001 Document Revised: 09/26/2013 Document Reviewed: 11/22/2012 [facility] Patient Information 2015 [place], [place]. This information is not intended to replace advice given to you by your health care provider. Make sure you discuss any questions you have with your health care provider.  [facility]    Phone: [phone]

## 2014-11-26 ENCOUNTER — Telehealth: Payer: Self-pay | Admitting: *Deleted

## 2014-11-26 ENCOUNTER — Inpatient Hospital Stay (HOSPITAL_COMMUNITY): Payer: Medicaid Other

## 2014-11-26 ENCOUNTER — Telehealth: Payer: Self-pay | Admitting: Advanced Practice Midwife

## 2014-11-26 ENCOUNTER — Encounter (HOSPITAL_COMMUNITY): Payer: Self-pay

## 2014-11-26 ENCOUNTER — Inpatient Hospital Stay (HOSPITAL_COMMUNITY)
Admission: AD | Admit: 2014-11-26 | Discharge: 2014-11-26 | Disposition: A | Discharge status: Home / Self Care | Payer: Medicaid Other | Source: Ambulatory Visit | Attending: Obstetrics & Gynecology | Admitting: Obstetrics & Gynecology

## 2014-11-26 DIAGNOSIS — O208 Other hemorrhage in early pregnancy: Secondary | ICD-10-CM | POA: Insufficient documentation

## 2014-11-26 DIAGNOSIS — Z3A14 14 weeks gestation of pregnancy: Secondary | ICD-10-CM | POA: Insufficient documentation

## 2014-11-26 DIAGNOSIS — Z87891 Personal history of nicotine dependence: Secondary | ICD-10-CM | POA: Diagnosis not present

## 2014-11-26 DIAGNOSIS — O4692 Antepartum hemorrhage, unspecified, second trimester: Secondary | ICD-10-CM

## 2014-11-26 DIAGNOSIS — O418X20 Other specified disorders of amniotic fluid and membranes, second trimester, not applicable or unspecified: Secondary | ICD-10-CM

## 2014-11-26 DIAGNOSIS — O468X2 Other antepartum hemorrhage, second trimester: Secondary | ICD-10-CM | POA: Diagnosis not present

## 2014-11-26 DIAGNOSIS — O4690 Antepartum hemorrhage, unspecified, unspecified trimester: Secondary | ICD-10-CM | POA: Insufficient documentation

## 2014-11-26 DIAGNOSIS — O209 Hemorrhage in early pregnancy, unspecified: Secondary | ICD-10-CM | POA: Diagnosis present

## 2014-11-26 LAB — URINALYSIS, ROUTINE W REFLEX MICROSCOPIC
Bilirubin Urine: NEGATIVE
Glucose, UA: NEGATIVE mg/dL
KETONES UR: NEGATIVE mg/dL
NITRITE: NEGATIVE
PH: 8 (ref 5.0–8.0)
Protein, ur: NEGATIVE mg/dL
SPECIFIC GRAVITY, URINE: 1.02 (ref 1.005–1.030)
Urobilinogen, UA: 1 mg/dL (ref 0.0–1.0)

## 2014-11-26 LAB — CBC
HEMATOCRIT: 33.3 % — AB (ref 36.0–46.0)
Hemoglobin: 10.8 g/dL — ABNORMAL LOW (ref 12.0–15.0)
MCH: 28.1 pg (ref 26.0–34.0)
MCHC: 32.4 g/dL (ref 30.0–36.0)
MCV: 86.5 fL (ref 78.0–100.0)
Platelets: 255 10*3/uL (ref 150–400)
RBC: 3.85 MIL/uL — AB (ref 3.87–5.11)
RDW: 14.4 % (ref 11.5–15.5)
WBC: 6 10*3/uL (ref 4.0–10.5)

## 2014-11-26 LAB — URINE MICROSCOPIC-ADD ON

## 2014-11-26 LAB — WET PREP, GENITAL
TRICH WET PREP: NONE SEEN
Yeast Wet Prep HPF POC: NONE SEEN

## 2014-11-26 LAB — OB RESULTS CONSOLE GC/CHLAMYDIA: Gonorrhea: NEGATIVE

## 2014-11-26 NOTE — Discharge Instructions (Signed)
Subchorionic Hematoma °A subchorionic hematoma is a gathering of blood between the outer wall of the placenta and the inner wall of the womb (uterus). The placenta is the organ that connects the fetus to the wall of the uterus. The placenta performs the feeding, breathing (oxygen to the fetus), and waste removal (excretory work) of the fetus.  °Subchorionic hematoma is the most common abnormality found on a result from ultrasonography done during the first trimester or early second trimester of pregnancy. If there has been little or no vaginal bleeding, early small hematomas usually shrink on their own and do not affect your baby or pregnancy. The blood is gradually absorbed over 1-2 weeks. When bleeding starts later in pregnancy or the hematoma is larger or occurs in an older pregnant woman, the outcome may not be as good. Larger hematomas may get bigger, which increases the chances for miscarriage. Subchorionic hematoma also increases the risk of premature detachment of the placenta from the uterus, preterm (premature) labor, and stillbirth. °HOME CARE INSTRUCTIONS °· Stay on bed rest if your health care provider recommends this. Although bed rest will not prevent more bleeding or prevent a miscarriage, your health care provider may recommend bed rest until you are advised otherwise. °· Avoid heavy lifting (more than 10 lb [4.5 kg]), exercise, sexual intercourse, or douching as directed by your health care provider. °· Keep track of the number of pads you use each day and how soaked (saturated) they are. Write down this information. °· Do not use tampons. °· Keep all follow-up appointments as directed by your health care provider. Your health care provider may ask you to have follow-up blood tests or ultrasound tests or both. °SEEK IMMEDIATE MEDICAL CARE IF: °· You have severe cramps in your stomach, back, abdomen, or pelvis. °· You have a fever. °· You pass large clots or tissue. Save any tissue for your health  care provider to look at. °· Your bleeding increases or you become lightheaded, feel weak, or have fainting episodes. °Document Released: 01/06/2007 Document Revised: 02/05/2014 Document Reviewed: 04/20/2013 °[employer]® Patient Information ©2015 [employer]. This information is not intended to replace advice given to you by your health care provider. Make sure you discuss any questions you have with your health care provider. ° °Pelvic Rest °Pelvic rest is sometimes recommended for women when:  °· The placenta is partially or completely covering the opening of the cervix (placenta previa). °· There is bleeding between the uterine wall and the amniotic sac in the first trimester (subchorionic hemorrhage). °· The cervix begins to open without labor starting (incompetent cervix, cervical insufficiency). °· The labor is too early (preterm labor). °HOME CARE INSTRUCTIONS °· Do not have sexual intercourse, stimulation, or an orgasm. °· Do not use tampons, douche, or put anything in the vagina. °· Do not lift anything over 10 pounds (4.5 kg). °· Avoid strenuous activity or straining your pelvic muscles. °SEEK MEDICAL CARE IF:  °· You have any vaginal bleeding during pregnancy. Treat this as a potential emergency. °· You have cramping pain felt low in the stomach (stronger than menstrual cramps). °· You notice vaginal discharge (watery, mucus, or bloody). °· You have a low, dull backache. °· There are regular contractions or uterine tightening. °SEEK IMMEDIATE MEDICAL CARE IF: °You have vaginal bleeding and have placenta previa.  °Document Released: 01/16/2011 Document Revised: 12/14/2011 Document Reviewed: 01/16/2011 °ExitCare® Patient Information ©2015 [employer]. This information is not intended to replace advice given to you by your health care provider.   Make sure you discuss any questions you have with your health care provider. ° °

## 2014-11-26 NOTE — MAU Provider Note (Signed)
Chief Complaint: Vaginal Bleeding   First Provider Initiated Contact with Patient 11/26/14 1421     SUBJECTIVE HPI: Lisa Hester is a 19 y.o. G1P0 at [redacted]w[redacted]d by LMP who presents to maternity admissions reporting vaginal bleeding described as light to moderate, starting as spotting 3 days ago, then requiring a pad yesterday, and lighter again today.  She has normal IUP with confirmed [facility] in first trimester.  She reports intercourse yesterday, but none for several weeks prior.  She denies abdominal cramping, vaginal itching/burning, urinary symptoms, h/a, dizziness, n/v, or fever/chills.     Past Medical History  Diagnosis Date  . Headache    History reviewed. No pertinent past surgical history. History   Social History  . Marital Status: Single    Spouse Name: N/A  . Number of Children: N/A  . Years of Education: N/A   Occupational History  . Not on file.   Social History Main Topics  . Smoking status: Former Smoker    Types: Cigars  . Smokeless tobacco: Never Used  . Alcohol Use: No  . Drug Use: No  . Sexual Activity: Yes    Birth Control/ Protection: None   Other Topics Concern  . Not on file   Social History Narrative   No current facility-administered medications on file prior to encounter.   Current Outpatient Prescriptions on File Prior to Encounter  Medication Sig Dispense Refill  . acetaminophen (TYLENOL) 500 MG tablet Take 500 mg by mouth every 6 (six) hours as needed (cramping).     . Prenatal Vit-Fe Fumarate-FA (PRENATAL MULTIVITAMIN) TABS tablet Take 1 tablet by mouth daily at 12 noon.     Allergies  Allergen Reactions  . Ginger Hives    ROS: Pertinent items in HPI  OBJECTIVE Blood pressure 107/55, pulse 102, temperature 98.9 F (37.2 C), temperature source Oral, resp. rate 16, last menstrual period 08/15/2014. GENERAL: Well-developed, well-nourished female in no acute distress.  HEENT: Normocephalic HEART: normal rate RESP: normal effort ABDOMEN:  Soft, non-tender EXTREMITIES: Nontender, no edema NEURO: Alert and oriented Pelvic exam: Cervix pink, visually closed, friable to cotton swab, scant tan discharge, no blood noted in vault, vaginal walls and external genitalia normal Cervix 0/long/high, firm, posterior  LAB RESULTS Results for orders placed or performed during the hospital encounter of 11/26/14 (from the past 24 hour(s))  Urinalysis, Routine w reflex microscopic     Status: Abnormal   Collection Time: 11/26/14 12:53 PM  Result Value Ref Range   Color, Urine YELLOW YELLOW   APPearance CLOUDY (A) CLEAR   Specific Gravity, Urine 1.020 1.005 - 1.030   pH 8.0 5.0 - 8.0   Glucose, UA NEGATIVE NEGATIVE mg/dL   Hgb urine dipstick TRACE (A) NEGATIVE   Bilirubin Urine NEGATIVE NEGATIVE   Ketones, ur NEGATIVE NEGATIVE mg/dL   Protein, ur NEGATIVE NEGATIVE mg/dL   Urobilinogen, UA 1.0 0.0 - 1.0 mg/dL   Nitrite NEGATIVE NEGATIVE   Leukocytes, UA SMALL (A) NEGATIVE  Urine microscopic-add on     Status: Abnormal   Collection Time: 11/26/14 12:53 PM  Result Value Ref Range   Squamous Epithelial / LPF FEW (A) RARE   WBC, UA 0-2 <3 WBC/hpf   RBC / HPF 0-2 <3 RBC/hpf   Bacteria, UA MANY (A) RARE   Urine-Other MUCOUS PRESENT   Wet prep, genital     Status: Abnormal   Collection Time: 11/26/14  2:25 PM  Result Value Ref Range   Yeast Wet Prep HPF POC NONE SEEN NONE  SEEN   Trich, Wet Prep NONE SEEN NONE SEEN   Clue Cells Wet Prep HPF POC FEW (A) NONE SEEN   WBC, Wet Prep HPF POC MANY (A) NONE SEEN  CBC     Status: Abnormal   Collection Time: 11/26/14  2:26 PM  Result Value Ref Range   WBC 6.0 4.0 - 10.5 K/uL   RBC 3.85 (L) 3.87 - 5.11 MIL/uL   Hemoglobin 10.8 (L) 12.0 - 15.0 g/dL   HCT 40.133.3 (L) 02.736.0 - 25.346.0 %   MCV 86.5 78.0 - 100.0 fL   MCH 28.1 26.0 - 34.0 pg   MCHC 32.4 30.0 - 36.0 g/dL   RDW 66.414.4 40.311.5 - 47.415.5 %   Platelets 255 150 - 400 K/uL    IMAGING Preliminary report with normal FHR, amniotic fluid, and small SCH  again noted   ASSESSMENT 1. Subchorionic hemorrhage in second trimester   2. Vaginal bleeding in pregnancy, second trimester     PLAN Discharge home with bleeding precautions Teaching done about [facility] Pelvic rest while bleeding      Follow-up Information    [facility]   Specialty:  Obstetrics and Gynecology   Why:  As scheduled   Contact information:   [address] [place] [phone]      [facility].   Why:  As needed for emergencies   Contact information:   [address] [id number] [place] [phone]      Sharen CounterLisa Leftwich-Kirby Certified Nurse-Midwife 11/26/2014  3:35 PM

## 2014-11-26 NOTE — Telephone Encounter (Signed)
Pt left VM. 14 weeks. Spotting, not feeling well. CNM called back. Pt already checked into MAU.

## 2014-11-26 NOTE — MAU Note (Signed)
C/O intermittent bleeding since Friday, every morning.  On toilet tissue as well as on pad. Lower abd cramping which has been ongoing.

## 2014-11-26 NOTE — Telephone Encounter (Signed)
Patient called and stated that she has been bleeding for the past 3 days heavily. I advised that she go to MAU. Patient is agreeable and has someone that can bring her.

## 2014-11-27 ENCOUNTER — Encounter: Payer: Self-pay | Admitting: *Deleted

## 2014-11-27 LAB — GC/CHLAMYDIA PROBE AMP (~~LOC~~) NOT AT ARMC
Chlamydia: NEGATIVE
Neisseria Gonorrhea: NEGATIVE

## 2014-12-19 ENCOUNTER — Ambulatory Visit (INDEPENDENT_AMBULATORY_CARE_PROVIDER_SITE_OTHER): Payer: Medicaid Other | Admitting: Obstetrics and Gynecology

## 2014-12-19 ENCOUNTER — Encounter: Payer: Self-pay | Admitting: Obstetrics and Gynecology

## 2014-12-19 VITALS — BP 120/66 | HR 96 | Wt 128.0 lb

## 2014-12-19 DIAGNOSIS — Z3402 Encounter for supervision of normal first pregnancy, second trimester: Secondary | ICD-10-CM

## 2014-12-19 LAB — POCT URINALYSIS DIP (DEVICE)
Bilirubin Urine: NEGATIVE
Glucose, UA: NEGATIVE mg/dL
Hgb urine dipstick: NEGATIVE
KETONES UR: NEGATIVE mg/dL
NITRITE: NEGATIVE
PH: 7 (ref 5.0–8.0)
PROTEIN: NEGATIVE mg/dL
Specific Gravity, Urine: 1.025 (ref 1.005–1.030)
Urobilinogen, UA: 0.2 mg/dL (ref 0.0–1.0)

## 2014-12-19 NOTE — Progress Notes (Signed)
AFP drawn. Anatomy US scheduled. Doing well. No concerns except occ left groin cramp with moving. RLP discussed.  FM and PTL precautions.

## 2014-12-19 NOTE — Progress Notes (Signed)
Reviewed US with pr> no cyst

## 2014-12-19 NOTE — Progress Notes (Signed)
Patient reports LLQ pain from cyst

## 2014-12-19 NOTE — Patient Instructions (Addendum)
Second Trimester of Pregnancy °The second trimester is from week 13 through week 28, months 4 through 6. The second trimester is often a time when you feel your best. Your body has also adjusted to being pregnant, and you begin to feel better physically. Usually, morning sickness has lessened or quit completely, you may have more energy, and you may have an increase in appetite. The second trimester is also a time when the fetus is growing rapidly. At the end of the sixth month, the fetus is about 9 inches long and weighs about 1½ pounds. You will likely begin to feel the baby move (quickening) between 18 and 20 weeks of the pregnancy. °BODY CHANGES °Your body goes through many changes during pregnancy. The changes vary from woman to woman.  °· Your weight will continue to increase. You will notice your lower abdomen bulging out. °· You may begin to get stretch marks on your hips, abdomen, and breasts. °· You may develop headaches that can be relieved by medicines approved by your health care provider. °· You may urinate more often because the fetus is pressing on your bladder. °· You may develop or continue to have heartburn as a result of your pregnancy. °· You may develop constipation because certain hormones are causing the muscles that push waste through your intestines to slow down. °· You may develop hemorrhoids or swollen, bulging veins (varicose veins). °· You may have back pain because of the weight gain and pregnancy hormones relaxing your joints between the bones in your pelvis and as a result of a shift in weight and the muscles that support your balance. °· Your breasts will continue to grow and be tender. °· Your gums may bleed and may be sensitive to brushing and flossing. °· Dark spots or blotches (chloasma, mask of pregnancy) may develop on your face. This will likely fade after the baby is born. °· A dark line from your belly button to the pubic area (linea nigra) may appear. This will likely fade  after the baby is born. °· You may have changes in your hair. These can include thickening of your hair, rapid growth, and changes in texture. Some women also have hair loss during or after pregnancy, or hair that feels dry or thin. Your hair will most likely return to normal after your baby is born. °WHAT TO EXPECT AT YOUR PRENATAL VISITS °During a routine prenatal visit: °· You will be weighed to make sure you and the fetus are growing normally. °· Your blood pressure will be taken. °· Your abdomen will be measured to track your baby's growth. °· The fetal heartbeat will be listened to. °· Any test results from the previous visit will be discussed. °Your health care provider may ask you: °· How you are feeling. °· If you are feeling the baby move. °· If you have had any abnormal symptoms, such as leaking fluid, bleeding, severe headaches, or abdominal cramping. °· If you have any questions. °Other tests that may be performed during your second trimester include: °· Blood tests that check for: °¨ Low iron levels (anemia). °¨ Gestational diabetes (between 24 and 28 weeks). °¨ Rh antibodies. °· Urine tests to check for infections, diabetes, or protein in the urine. °· An ultrasound to confirm the proper growth and development of the baby. °· An amniocentesis to check for possible genetic problems. °· Fetal screens for spina bifida and Down syndrome. °HOME CARE INSTRUCTIONS  °· Avoid all smoking, herbs, alcohol, and unprescribed   drugs. These chemicals affect the formation and growth of the baby. °· Follow your health care provider's instructions regarding medicine use. There are medicines that are either safe or unsafe to take during pregnancy. °· Exercise only as directed by your health care provider. Experiencing uterine cramps is a good sign to stop exercising. °· Continue to eat regular, healthy meals. °· Wear a good support bra for breast tenderness. °· Do not use hot tubs, steam rooms, or saunas. °· Wear your  seat belt at all times when driving. °· Avoid raw meat, uncooked cheese, cat litter boxes, and soil used by cats. These carry germs that can cause birth defects in the baby. °· Take your prenatal vitamins. °· Try taking a stool softener (if your health care provider approves) if you develop constipation. Eat more high-fiber foods, such as fresh vegetables or fruit and whole grains. Drink plenty of fluids to keep your urine clear or pale yellow. °· Take warm sitz baths to soothe any pain or discomfort caused by hemorrhoids. Use hemorrhoid cream if your health care provider approves. °· If you develop varicose veins, wear support hose. Elevate your feet for 15 minutes, 3-4 times a day. Limit salt in your diet. °· Avoid heavy lifting, wear low heel shoes, and practice good posture. °· Rest with your legs elevated if you have leg cramps or low back pain. °· Visit your dentist if you have not gone yet during your pregnancy. Use a soft toothbrush to brush your teeth and be gentle when you floss. °· A sexual relationship may be continued unless your health care provider directs you otherwise. °· Continue to go to all your prenatal visits as directed by your health care provider. °SEEK MEDICAL CARE IF:  °· You have dizziness. °· You have mild pelvic cramps, pelvic pressure, or nagging pain in the abdominal area. °· You have persistent nausea, vomiting, or diarrhea. °· You have a bad smelling vaginal discharge. °· You have pain with urination. °SEEK IMMEDIATE MEDICAL CARE IF:  °· You have a fever. °· You are leaking fluid from your vagina. °· You have spotting or bleeding from your vagina. °· You have severe abdominal cramping or pain. °· You have rapid weight gain or loss. °· You have shortness of breath with chest pain. °· You notice sudden or extreme swelling of your face, hands, ankles, feet, or legs. °· You have not felt your baby move in over an hour. °· You have severe headaches that do not go away with  medicine. °· You have vision changes. °Document Released: 09/15/2001 Document Revised: 09/26/2013 Document Reviewed: 11/22/2012 °ExitCare® Patient Information ©2015 [employer]. This information is not intended to replace advice given to you by your health care provider. Make sure you discuss any questions you have with your health care provider. ° °Round Ligament Pain During Pregnancy °Round ligament pain is a sharp pain or jabbing feeling often felt in the lower belly or groin area on one or both sides. It is one of the most common complaints during pregnancy and is considered a normal part of pregnancy. It is most often felt during the second trimester. ° °Here is what you need to know about round ligament pain, including some tips to help you feel better. ° °Causes of Round Ligament Pain ° °Several thick ligaments surround and support your womb (uterus) as it grows during pregnancy. One of them is called the round ligament. ° °The round ligament connects the front part of the womb to   your groin, the area where your legs attach to your pelvis. The round ligament normally tightens and relaxes slowly. ° °As your baby and womb grow, the round ligament stretches. That makes it more likely to become strained. ° °Sudden movements can cause the ligament to tighten quickly, like a rubber band snapping. This causes a sudden and quick jabbing feeling. ° °Symptoms of Round Ligament Pain ° °Round ligament pain can be concerning and uncomfortable. But it is considered normal as your body changes during pregnancy. ° °The symptoms of round ligament pain include a sharp, sudden spasm in the belly. It usually affects the right side, but it may happen on both sides. The pain only lasts a few seconds. ° °Exercise may cause the pain, as will rapid movements such as: ° °sneezing °coughing °laughing °rolling over in bed °standing up too quickly ° °Treatment of Round Ligament Pain ° °Here are some tips that may help reduce your  discomfort: ° °Pain relief. Take over-the-counter acetaminophen for pain, if necessary. Ask your doctor if this is OK. ° °Exercise. Get plenty of exercise to keep your stomach (core) muscles strong. Doing stretching exercises or prenatal yoga can be helpful. Ask your doctor which exercises are safe for you and your baby. ° °A helpful exercise involves putting your hands and knees on the floor, lowering your head, and pushing your backside into the air. ° °Avoid sudden movements. Change positions slowly (such as standing up or sitting down) to avoid sudden movements that may cause stretching and pain. ° °Flex your hips. Bend and flex your hips before you cough, sneeze, or laugh to avoid pulling on the ligaments. ° °Apply warmth. A heating pad or warm bath may be helpful. Ask your doctor if this is OK. Extreme heat can be dangerous to the baby. ° °You should try to modify your daily activity level and avoid positions that may worsen the condition. ° °When to Call the Doctor/Midwife ° °Always tell your doctor or midwife about any type of pain you have during pregnancy. Round ligament pain is quick and doesn't last long. ° °Call your health care provider immediately if you have: ° °severe pain °fever °chills °pain on urination °difficulty walking ° °Belly pain during pregnancy can be due to many different causes. It is important for your doctor to rule out more serious conditions, including pregnancy complications such as placenta abruption or non-pregnancy illnesses such as: ° °inguinal hernia °appendicitis °stomach, liver, and kidney problems °Preterm labor pains may sometimes be mistaken for round ligament pain. ° °

## 2014-12-20 LAB — AFP, QUAD SCREEN
AFP: 55.6 ng/mL
Curr Gest Age: 18 wks.days
HCG, Total: 44.91 IU/mL
INH: 226.4 pg/mL
Interpretation-AFP: NEGATIVE
MOM FOR AFP: 1.03
MOM FOR HCG: 1.33
MOM FOR INH: 1.26
Open Spina bifida: NEGATIVE
Osb Risk: 1:23800 {titer}
TRI 18 SCR RISK EST: NEGATIVE
UE3 VALUE: 0.79 ng/mL
uE3 Mom: 0.59

## 2014-12-31 ENCOUNTER — Ambulatory Visit (HOSPITAL_COMMUNITY)
Admission: RE | Admit: 2014-12-31 | Discharge: 2014-12-31 | Disposition: A | Discharge status: Home / Self Care | Payer: Medicaid Other | Source: Ambulatory Visit | Attending: Obstetrics and Gynecology | Admitting: Obstetrics and Gynecology

## 2014-12-31 ENCOUNTER — Other Ambulatory Visit: Payer: Self-pay | Admitting: General Practice

## 2014-12-31 DIAGNOSIS — Z3A19 19 weeks gestation of pregnancy: Secondary | ICD-10-CM | POA: Diagnosis not present

## 2014-12-31 DIAGNOSIS — Z3402 Encounter for supervision of normal first pregnancy, second trimester: Secondary | ICD-10-CM

## 2014-12-31 DIAGNOSIS — Z36 Encounter for antenatal screening of mother: Secondary | ICD-10-CM | POA: Diagnosis present

## 2015-01-04 DIAGNOSIS — Z3689 Encounter for other specified antenatal screening: Secondary | ICD-10-CM | POA: Insufficient documentation

## 2015-01-04 DIAGNOSIS — Z0375 Encounter for suspected cervical shortening ruled out: Secondary | ICD-10-CM | POA: Insufficient documentation

## 2015-01-04 DIAGNOSIS — Z3A19 19 weeks gestation of pregnancy: Secondary | ICD-10-CM | POA: Insufficient documentation

## 2015-01-10 ENCOUNTER — Encounter (HOSPITAL_COMMUNITY): Payer: Self-pay | Admitting: *Deleted

## 2015-01-10 ENCOUNTER — Inpatient Hospital Stay (HOSPITAL_COMMUNITY)
Admission: AD | Admit: 2015-01-10 | Discharge: 2015-01-10 | Disposition: A | Discharge status: Home / Self Care | Payer: Medicaid Other | Source: Ambulatory Visit | Attending: Obstetrics & Gynecology | Admitting: Obstetrics & Gynecology

## 2015-01-10 DIAGNOSIS — N949 Unspecified condition associated with female genital organs and menstrual cycle: Secondary | ICD-10-CM

## 2015-01-10 DIAGNOSIS — R102 Pelvic and perineal pain: Secondary | ICD-10-CM | POA: Diagnosis not present

## 2015-01-10 DIAGNOSIS — Z87891 Personal history of nicotine dependence: Secondary | ICD-10-CM | POA: Insufficient documentation

## 2015-01-10 DIAGNOSIS — Z3A21 21 weeks gestation of pregnancy: Secondary | ICD-10-CM | POA: Diagnosis not present

## 2015-01-10 DIAGNOSIS — R42 Dizziness and giddiness: Secondary | ICD-10-CM | POA: Insufficient documentation

## 2015-01-10 DIAGNOSIS — O9989 Other specified diseases and conditions complicating pregnancy, childbirth and the puerperium: Secondary | ICD-10-CM | POA: Insufficient documentation

## 2015-01-10 DIAGNOSIS — R109 Unspecified abdominal pain: Secondary | ICD-10-CM | POA: Insufficient documentation

## 2015-01-10 LAB — URINE MICROSCOPIC-ADD ON

## 2015-01-10 LAB — URINALYSIS, ROUTINE W REFLEX MICROSCOPIC
BILIRUBIN URINE: NEGATIVE
Glucose, UA: NEGATIVE mg/dL
Hgb urine dipstick: NEGATIVE
Ketones, ur: NEGATIVE mg/dL
Nitrite: NEGATIVE
Protein, ur: NEGATIVE mg/dL
SPECIFIC GRAVITY, URINE: 1.02 (ref 1.005–1.030)
UROBILINOGEN UA: 0.2 mg/dL (ref 0.0–1.0)
pH: 7 (ref 5.0–8.0)

## 2015-01-10 NOTE — Discharge Instructions (Signed)
Continue to drink plenty of fluids.  Based on your exam, I think that you are having round ligament pain.  This is common in pregnancy.  Obtain a pregnancy belt to help relieve this pain.  You may also take Tylenol safely during pregnancy.    Abdominal Pain During Pregnancy Belly (abdominal) pain is common during pregnancy. Most of the time, it is not a serious problem. Other times, it can be a sign that something is wrong with the pregnancy. Always tell your doctor if you have belly pain. HOME CARE Monitor your belly pain for any changes. The following actions may help you feel better:  Do not have sex (intercourse) or put anything in your vagina until you feel better.  Rest until your pain stops.  Drink clear fluids if you feel sick to your stomach (nauseous). Do not eat solid food until you feel better.  Only take medicine as told by your doctor.  Keep all doctor visits as told. GET HELP RIGHT AWAY IF:   You are bleeding, leaking fluid, or pieces of tissue come out of your vagina.  You have more pain or cramping.  You keep throwing up (vomiting).  You have pain when you pee (urinate) or have blood in your pee.  You have a fever.  You do not feel your baby moving as much.  You feel very weak or feel like passing out.  You have trouble breathing, with or without belly pain.  You have a very bad headache and belly pain.  You have fluid leaking from your vagina and belly pain.  You keep having watery poop (diarrhea).  Your belly pain does not go away after resting, or the pain gets worse. MAKE SURE YOU:   Understand these instructions.  Will watch your condition.  Will get help right away if you are not doing well or get worse. Document Released: 09/09/2009 Document Revised: 05/24/2013 Document Reviewed: 04/20/2013 [facility] Patient Information 2015 [place], [place]. This information is not intended to replace advice given to you by your health care provider. Make sure  you discuss any questions you have with your health care provider.

## 2015-01-10 NOTE — MAU Note (Signed)
Pt reports she is having increased abd cramping, Started having cramping on Tues was mild and intermittent  Today more frequent ad more uncomforatable.

## 2015-01-10 NOTE — MAU Provider Note (Signed)
History     CSN: [id number]  Arrival date and time: 01/10/15 1330   None     Chief Complaint  Patient presents with  . Abdominal Pain   HPI  Patient is 19 y.o. G1P0 [redacted]w[redacted]d here with complaints of sharp, abdominal cramping.  +FM, denies LOF, VB, contractions, vaginal discharge.  Patient reports sharp cramping that began on Tues afternoon.  Pain is under her belly and does not radiate.  Resolved, then started again yesterday.  Resolved and then started again this morning and has not gone away.  Worse with standing, better with sitting.  Endorses feeling lightheaded/ dizziness that started today.  Notices that this is worse with changes in position (seated to standing).  Reports not eating anything but a boiled egg and a banana today.  Decreased water intake.  Endorses sick contacts.  Endorses some nausea with eating.  No fevers, diarrhea, vomiting, cough, congestion, sore throat, headache.    OB History    Gravida Para Term Preterm AB TAB SAB Ectopic Multiple Living   1               Past Medical History  Diagnosis Date  . Headache     Past Surgical History  Procedure Laterality Date  . Wisdom tooth extraction      History reviewed. No pertinent family history.  History  Substance Use Topics  . Smoking status: Former Smoker    Types: Cigars  . Smokeless tobacco: Never Used  . Alcohol Use: No    Allergies:  Allergies  Allergen Reactions  . Ginger Hives    Prescriptions prior to admission  Medication Sig Dispense Refill Last Dose  . acetaminophen (TYLENOL) 500 MG tablet Take 500 mg by mouth every 6 (six) hours as needed (cramping).    Taking  . Prenatal Vit-Fe Fumarate-FA (PRENATAL MULTIVITAMIN) TABS tablet Take 1 tablet by mouth daily at 12 noon.   Taking    ROS Physical Exam   Blood pressure 118/66, pulse 92, temperature 98.3 F (36.8 C), resp. rate 18, height  (1.6 m), weight 136 lb 3.2 oz (61.78 kg), last menstrual period 08/15/2014.  Physical Exam   Constitutional: She is oriented to person, place, and time. She appears well-developed and well-nourished. No distress.  HENT:  Head: Normocephalic and atraumatic.  Eyes: Conjunctivae and EOM are normal. No scleral icterus.  Neck: Normal range of motion.  Cardiovascular: Normal rate, regular rhythm, normal heart sounds and intact distal pulses.   Respiratory: Breath sounds normal. No respiratory distress. She has no wheezes.  GI: Bowel sounds are normal. There is tenderness (along inferior aspect of belly, no peritoneal signs). There is no rebound and no guarding.  Gravid   Musculoskeletal: Normal range of motion. She exhibits no edema.  Lymphadenopathy:    She has no cervical adenopathy.  Neurological: She is alert and oriented to person, place, and time. No cranial nerve deficit.  Skin: Skin is warm and dry. No rash noted. She is not diaphoretic.  Psychiatric: She has a normal mood and affect. Her behavior is normal.   Urinalysis    Component Value Date/Time   COLORURINE YELLOW 01/10/2015 1346   APPEARANCEUR CLEAR 01/10/2015 1346   LABSPEC 1.020 01/10/2015 1346   PHURINE 7.0 01/10/2015 1346   GLUCOSEU NEGATIVE 01/10/2015 1346   HGBUR NEGATIVE 01/10/2015 1346   BILIRUBINUR NEGATIVE 01/10/2015 1346   KETONESUR NEGATIVE 01/10/2015 1346   PROTEINUR NEGATIVE 01/10/2015 1346   UROBILINOGEN 0.2 01/10/2015 1346   NITRITE NEGATIVE  01/10/2015 1346   LEUKOCYTESUR SMALL* 01/10/2015 1346    MAU Course  Procedures  MDM  Fetal monitoringBaseline: 152 bpm on doppler Uterine activity none UA negative for infection. Patient hydrated in MAU, tolerated fluids well.  Assessment and Plan  H&P consistent with round ligament pain.  Discussed with patient.  Instructed to obtain a pregnancy belt.  Dizziness likely 2/2 poor PO intake today.  Return precautions discussed with patient.  Delynn FlavinGottschalk, Bowe Sidor M, DO 01/10/2015, 2:32 PM

## 2015-01-16 ENCOUNTER — Encounter: Payer: Medicaid Other | Admitting: Certified Nurse Midwife

## 2015-02-05 ENCOUNTER — Ambulatory Visit (INDEPENDENT_AMBULATORY_CARE_PROVIDER_SITE_OTHER): Payer: Medicaid Other | Admitting: Obstetrics and Gynecology

## 2015-02-05 VITALS — BP 118/64 | HR 101 | Temp 98.2°F | Wt 142.0 lb

## 2015-02-05 DIAGNOSIS — Z3402 Encounter for supervision of normal first pregnancy, second trimester: Secondary | ICD-10-CM

## 2015-02-05 LAB — POCT URINALYSIS DIP (DEVICE)
Bilirubin Urine: NEGATIVE
Glucose, UA: NEGATIVE mg/dL
Hgb urine dipstick: NEGATIVE
Ketones, ur: NEGATIVE mg/dL
Nitrite: NEGATIVE
Protein, ur: NEGATIVE mg/dL
Specific Gravity, Urine: 1.02 (ref 1.005–1.030)
Urobilinogen, UA: 0.2 mg/dL (ref 0.0–1.0)
pH: 7.5 (ref 5.0–8.0)

## 2015-02-05 NOTE — Progress Notes (Signed)
NT and anatomic US reviewed> normal and confirms dates. No UTI sx. RLP resolved. Works at [employer].

## 2015-02-05 NOTE — Progress Notes (Signed)
Recommended flinstones vitamins to patient instead of PNV; mod leuks on UA

## 2015-02-05 NOTE — Patient Instructions (Signed)
Second Trimester of Pregnancy The second trimester is from week 13 through week 28, months 4 through 6. The second trimester is often a time when you feel your best. Your body has also adjusted to being pregnant, and you begin to feel better physically. Usually, morning sickness has lessened or quit completely, you may have more energy, and you may have an increase in appetite. The second trimester is also a time when the fetus is growing rapidly. At the end of the sixth month, the fetus is about 9 inches long and weighs about 1 pounds. You will likely begin to feel the baby move (quickening) between 18 and 20 weeks of the pregnancy. BODY CHANGES Your body goes through many changes during pregnancy. The changes vary from woman to woman.   Your weight will continue to increase. You will notice your lower abdomen bulging out.  You may begin to get stretch marks on your hips, abdomen, and breasts.  You may develop headaches that can be relieved by medicines approved by your health care provider.  You may urinate more often because the fetus is pressing on your bladder.  You may develop or continue to have heartburn as a result of your pregnancy.  You may develop constipation because certain hormones are causing the muscles that push waste through your intestines to slow down.  You may develop hemorrhoids or swollen, bulging veins (varicose veins).  You may have back pain because of the weight gain and pregnancy hormones relaxing your joints between the bones in your pelvis and as a result of a shift in weight and the muscles that support your balance.  Your breasts will continue to grow and be tender.  Your gums may bleed and may be sensitive to brushing and flossing.  Dark spots or blotches (chloasma, mask of pregnancy) may develop on your face. This will likely fade after the baby is born.  A dark line from your belly button to the pubic area (linea nigra) may appear. This will likely fade  after the baby is born.  You may have changes in your hair. These can include thickening of your hair, rapid growth, and changes in texture. Some women also have hair loss during or after pregnancy, or hair that feels dry or thin. Your hair will most likely return to normal after your baby is born. WHAT TO EXPECT AT YOUR PRENATAL VISITS During a routine prenatal visit:  You will be weighed to make sure you and the fetus are growing normally.  Your blood pressure will be taken.  Your abdomen will be measured to track your baby's growth.  The fetal heartbeat will be listened to.  Any test results from the previous visit will be discussed. Your health care provider may ask you:  How you are feeling.  If you are feeling the baby move.  If you have had any abnormal symptoms, such as leaking fluid, bleeding, severe headaches, or abdominal cramping.  If you have any questions. Other tests that may be performed during your second trimester include:  Blood tests that check for:  Low iron levels (anemia).  Gestational diabetes (between 24 and 28 weeks).  Rh antibodies.  Urine tests to check for infections, diabetes, or protein in the urine.  An ultrasound to confirm the proper growth and development of the baby.  An amniocentesis to check for possible genetic problems.  Fetal screens for spina bifida and Down syndrome. HOME CARE INSTRUCTIONS   Avoid all smoking, herbs, alcohol, and unprescribed   drugs. These chemicals affect the formation and growth of the baby.  Follow your health care provider's instructions regarding medicine use. There are medicines that are either safe or unsafe to take during pregnancy.  Exercise only as directed by your health care provider. Experiencing uterine cramps is a good sign to stop exercising.  Continue to eat regular, healthy meals.  Wear a good support bra for breast tenderness.  Do not use hot tubs, steam rooms, or saunas.  Wear your  seat belt at all times when driving.  Avoid raw meat, uncooked cheese, cat litter boxes, and soil used by cats. These carry germs that can cause birth defects in the baby.  Take your prenatal vitamins.  Try taking a stool softener (if your health care provider approves) if you develop constipation. Eat more high-fiber foods, such as fresh vegetables or fruit and whole grains. Drink plenty of fluids to keep your urine clear or pale yellow.  Take warm sitz baths to soothe any pain or discomfort caused by hemorrhoids. Use hemorrhoid cream if your health care provider approves.  If you develop varicose veins, wear support hose. Elevate your feet for 15 minutes, 3-4 times a day. Limit salt in your diet.  Avoid heavy lifting, wear low heel shoes, and practice good posture.  Rest with your legs elevated if you have leg cramps or low back pain.  Visit your dentist if you have not gone yet during your pregnancy. Use a soft toothbrush to brush your teeth and be gentle when you floss.  A sexual relationship may be continued unless your health care provider directs you otherwise.  Continue to go to all your prenatal visits as directed by your health care provider. SEEK MEDICAL CARE IF:   You have dizziness.  You have mild pelvic cramps, pelvic pressure, or nagging pain in the abdominal area.  You have persistent nausea, vomiting, or diarrhea.  You have a bad smelling vaginal discharge.  You have pain with urination. SEEK IMMEDIATE MEDICAL CARE IF:   You have a fever.  You are leaking fluid from your vagina.  You have spotting or bleeding from your vagina.  You have severe abdominal cramping or pain.  You have rapid weight gain or loss.  You have shortness of breath with chest pain.  You notice sudden or extreme swelling of your face, hands, ankles, feet, or legs.  You have not felt your baby move in over an hour.  You have severe headaches that do not go away with  medicine.  You have vision changes. Document Released: 09/15/2001 Document Revised: 09/26/2013 Document Reviewed: 11/22/2012 [employer] Patient Information 2015 [employer]. This information is not intended to replace advice given to you by your health care provider. Make sure you discuss any questions you have with your health care provider.  

## 2015-04-03 ENCOUNTER — Encounter: Payer: Medicaid Other | Admitting: Advanced Practice Midwife

## 2015-04-04 ENCOUNTER — Inpatient Hospital Stay (HOSPITAL_COMMUNITY)
Admission: AD | Admit: 2015-04-04 | Discharge: 2015-04-04 | Disposition: A | Discharge status: Home / Self Care | Payer: Medicaid Other | Source: Ambulatory Visit | Attending: Obstetrics and Gynecology | Admitting: Obstetrics and Gynecology

## 2015-04-04 ENCOUNTER — Encounter (HOSPITAL_COMMUNITY): Payer: Self-pay | Admitting: *Deleted

## 2015-04-04 DIAGNOSIS — Z87891 Personal history of nicotine dependence: Secondary | ICD-10-CM | POA: Insufficient documentation

## 2015-04-04 DIAGNOSIS — Z3A33 33 weeks gestation of pregnancy: Secondary | ICD-10-CM | POA: Insufficient documentation

## 2015-04-04 HISTORY — DX: Anemia, unspecified: D64.9

## 2015-04-04 MED ORDER — NIFEDIPINE 10 MG PO CAPS
10.0000 mg | ORAL_CAPSULE | Freq: Once | ORAL | Status: AC
  Administered 2015-04-04: 10 mg via ORAL
  Filled 2015-04-04: qty 1

## 2015-04-04 MED ORDER — BETAMETHASONE SOD PHOS & ACET 6 (3-3) MG/ML IJ SUSP
12.0000 mg | Freq: Once | INTRAMUSCULAR | Status: AC
  Administered 2015-04-04: 12 mg via INTRAMUSCULAR
  Filled 2015-04-04: qty 2

## 2015-04-04 MED ORDER — NIFEDIPINE 10 MG PO CAPS: 10.0000 mg | ORAL_CAPSULE | Freq: Four times a day (QID) | ORAL | Status: DC

## 2015-04-04 NOTE — MAU Note (Signed)
Pt came in EMS, states she was having slight cramping yesterday.  Began cramping again today around 0900, began feeling pelvic pressure, cramping became worse around 1000.  Denies bleeding or LOF, states she has more discharge than usual.

## 2015-04-04 NOTE — MAU Provider Note (Signed)
  History   Increased contractions today. No bleeding or LOF. Charlesetta GaribaldiGood FM  CSN: [id number]  Arrival date and time: 04/04/15 1136   First Provider Initiated Contact with Patient 04/04/15 1250      Chief Complaint  Patient presents with  . Abdominal Cramping   HPI  OB History    Gravida Para Term Preterm AB TAB SAB Ectopic Multiple Living   1               Past Medical History  Diagnosis Date  . Headache   . Anemia     Past Surgical History  Procedure Laterality Date  . Wisdom tooth extraction      History reviewed. No pertinent family history.  History  Substance Use Topics  . Smoking status: Former Smoker    Types: Cigars  . Smokeless tobacco: Never Used  . Alcohol Use: No    Allergies:  Allergies  Allergen Reactions  . Ginger Hives    Prescriptions prior to admission  Medication Sig Dispense Refill Last Dose  . Prenatal Vit-Fe Fumarate-FA (PRENATAL MULTIVITAMIN) TABS tablet Take 1 tablet by mouth daily at 12 noon.   04/04/2015 at Unknown time    ROS Physical Exam  WDWN AAF Uncomfortable NAD Neck supple CV: RRR Abd: gravid NT No CVAT VE per RN Neuro Nonfocal  SKin Intact Ext : neg c/c/e  Blood pressure 123/88, pulse 93, temperature 97.6 F (36.4 C), temperature source Oral, resp. rate 20, last menstrual period 08/15/2014.  Physical Exam  MAU Course  Procedures  Category 1 tracing Contractions q 5-10  MDM na  Assessment and Plan  33 weeks Preterm contractions, no evidence of cervical change Procardia BMZ Will reck cervix and likely dc home  Fu office 7/5 Aubrey Blackard J 04/04/2015, 12:54 PM

## 2015-04-04 NOTE — MAU Note (Signed)
Pitcher of water given for po hydration.

## 2015-04-04 NOTE — Discharge Instructions (Signed)

## 2015-04-17 LAB — OB RESULTS CONSOLE GBS: GBS: POSITIVE

## 2015-04-23 ENCOUNTER — Inpatient Hospital Stay (HOSPITAL_COMMUNITY)
Admission: AD | Admit: 2015-04-23 | Discharge: 2015-04-24 | Disposition: A | Discharge status: Home / Self Care | Payer: Medicaid Other | Source: Ambulatory Visit | Attending: Obstetrics and Gynecology | Admitting: Obstetrics and Gynecology

## 2015-04-23 DIAGNOSIS — Z3A36 36 weeks gestation of pregnancy: Secondary | ICD-10-CM | POA: Insufficient documentation

## 2015-04-23 DIAGNOSIS — E860 Dehydration: Secondary | ICD-10-CM | POA: Insufficient documentation

## 2015-04-23 DIAGNOSIS — Z87891 Personal history of nicotine dependence: Secondary | ICD-10-CM | POA: Insufficient documentation

## 2015-04-23 NOTE — MAU Note (Signed)
Pt states contractions since 4pm that are irregular. Denies vag bleeding or LOF. +FM

## 2015-04-24 ENCOUNTER — Encounter (HOSPITAL_COMMUNITY): Payer: Self-pay | Admitting: *Deleted

## 2015-04-24 DIAGNOSIS — E860 Dehydration: Secondary | ICD-10-CM | POA: Diagnosis not present

## 2015-04-24 DIAGNOSIS — Z87891 Personal history of nicotine dependence: Secondary | ICD-10-CM | POA: Diagnosis not present

## 2015-04-24 DIAGNOSIS — Z3A36 36 weeks gestation of pregnancy: Secondary | ICD-10-CM | POA: Diagnosis not present

## 2015-04-24 LAB — URINALYSIS, ROUTINE W REFLEX MICROSCOPIC
Bilirubin Urine: NEGATIVE
Glucose, UA: 100 mg/dL — AB
Hgb urine dipstick: NEGATIVE
Ketones, ur: NEGATIVE mg/dL
Nitrite: NEGATIVE
PROTEIN: NEGATIVE mg/dL
SPECIFIC GRAVITY, URINE: 1.02 (ref 1.005–1.030)
UROBILINOGEN UA: 0.2 mg/dL (ref 0.0–1.0)
pH: 7 (ref 5.0–8.0)

## 2015-04-24 LAB — URINE MICROSCOPIC-ADD ON

## 2015-04-24 MED ORDER — LACTATED RINGERS IV SOLN
INTRAVENOUS | Status: DC
  Administered 2015-04-24: 02:00:00 via INTRAVENOUS

## 2015-04-24 MED ORDER — ZOLPIDEM TARTRATE 5 MG PO TABS
5.0000 mg | ORAL_TABLET | Freq: Once | ORAL | Status: AC
  Administered 2015-04-24: 5 mg via ORAL
  Filled 2015-04-24: qty 1

## 2015-04-24 NOTE — MAU Note (Signed)
Pt states that she began to contract at 1600 and they have become regular. Pt denies LOF and bleeding and states that baby is active.

## 2015-04-24 NOTE — MAU Note (Signed)
IV to be started and more fluids to be given.

## 2015-04-24 NOTE — MAU Provider Note (Signed)
  History     CSN: [id number]  Arrival date and time: 04/23/15 2341   None     No chief complaint on file.  HPI Comments: G2P0010 @36 .1 weeks here with regular ctx since 1600. No LOF or VB. +FM. Pregnancy complicated by preterm cervical thinning since 28 wks and preterm ctx w/o cervical dilation, S>D, and GBS positive.    Past Medical History  Diagnosis Date  . Headache   . Anemia     Past Surgical History  Procedure Laterality Date  . Wisdom tooth extraction      History reviewed. No pertinent family history.  History  Substance Use Topics  . Smoking status: Former Smoker    Types: Cigars  . Smokeless tobacco: Never Used  . Alcohol Use: No    Allergies:  Allergies  Allergen Reactions  . Ginger Hives    Prescriptions prior to admission  Medication Sig Dispense Refill Last Dose  . NIFEdipine (PROCARDIA) 10 MG capsule Take 1 capsule (10 mg total) by mouth 4 (four) times daily. 50 capsule 2 04/24/2015 at Unknown time  . Prenatal Vit-Fe Fumarate-FA (PRENATAL MULTIVITAMIN) TABS tablet Take 1 tablet by mouth daily at 12 noon.   Past Month at Unknown time    Review of Systems  Constitutional: Negative.   HENT: Negative.   Eyes: Negative.   Respiratory: Negative.   Cardiovascular: Negative.   Gastrointestinal: Positive for abdominal pain.  Genitourinary: Negative.   Musculoskeletal: Negative.   Skin: Negative.   Neurological: Negative.   Endo/Heme/Allergies: Negative.   Psychiatric/Behavioral: Negative.    Physical Exam   Last menstrual period 08/15/2014.  Physical Exam  Constitutional: She is oriented to person, place, and time. She appears well-developed and well-nourished.  HENT:  Head: Normocephalic and atraumatic.  Neck: Normal range of motion. Neck supple.  Cardiovascular: Normal rate.   Respiratory: Effort normal.  GI: Soft.  gravid  Genitourinary: Vagina normal.  SVE: closed/50%/out of pelvis  Musculoskeletal: Normal range of motion.   Neurological: She is alert and oriented to person, place, and time.  Skin: Skin is warm and dry.  Psychiatric: She has a normal mood and affect.  EFM: 145 bpm, mod variability, +accels, no decels Toco: irregular with UI  MAU Course  Procedures NST non-reactive initially/IVF bolus given/ reactive NST/ decreased uterine activity/ able to rest Po hydration Cervical exam x3 unchanged  Assessment and Plan  36.[redacted] weeks gestation Preterm contractions Dehydration  Discharge home PTL precautions Follow-up at [facility] in 2 days as scheduled Ambien prior to discharge Call on-call provider before hospital visits  Franchot Pollitt, N 04/24/2015, 2:28 AM

## 2015-04-24 NOTE — Discharge Instructions (Signed)
Braxton Hicks Contractions °Contractions of the uterus can occur throughout pregnancy. Contractions are not always a sign that you are in labor.  °WHAT ARE BRAXTON HICKS CONTRACTIONS?  °Contractions that occur before labor are called Braxton Hicks contractions, or false labor. Toward the end of pregnancy (32-34 weeks), these contractions can develop more often and may become more forceful. This is not true labor because these contractions do not result in opening (dilatation) and thinning of the cervix. They are sometimes difficult to tell apart from true labor because these contractions can be forceful and people have different pain tolerances. You should not feel embarrassed if you go to the hospital with false labor. Sometimes, the only way to tell if you are in true labor is for your health care provider to look for changes in the cervix. °If there are no prenatal problems or other health problems associated with the pregnancy, it is completely safe to be sent home with false labor and await the onset of true labor. °HOW CAN YOU TELL THE DIFFERENCE BETWEEN TRUE AND FALSE LABOR? °False Labor °· The contractions of false labor are usually shorter and not as hard as those of true labor.   °· The contractions are usually irregular.   °· The contractions are often felt in the front of the lower abdomen and in the groin.   °· The contractions may go away when you walk around or change positions while lying down.   °· The contractions get weaker and are shorter lasting as time goes on.   °· The contractions do not usually become progressively stronger, regular, and closer together as with true labor.   °True Labor °· Contractions in true labor last 30-70 seconds, become very regular, usually become more intense, and increase in frequency.   °· The contractions do not go away with walking.   °· The discomfort is usually felt in the top of the uterus and spreads to the lower abdomen and low back.   °· True labor can be  determined by your health care provider with an exam. This will show that the cervix is dilating and getting thinner.   °WHAT TO REMEMBER °· Keep up with your usual exercises and follow other instructions given by your health care provider.   °· Take medicines as directed by your health care provider.   °· Keep your regular prenatal appointments.   °· Eat and drink lightly if you think you are going into labor.   °· If Braxton Hicks contractions are making you uncomfortable:   °¨ Change your position from lying down or resting to walking, or from walking to resting.   °¨ Sit and rest in a tub of warm water.   °¨ Drink 2-3 glasses of water. Dehydration may cause these contractions.   °¨ Do slow and deep breathing several times an hour.   °WHEN SHOULD I SEEK IMMEDIATE MEDICAL CARE? °Seek immediate medical care if: °· Your contractions become stronger, more regular, and closer together.   °· You have fluid leaking or gushing from your vagina.   °· You have a fever.   °· You pass blood-tinged mucus.   °· You have vaginal bleeding.   °· You have continuous abdominal pain.   °· You have low back pain that you never had before.   °· You feel your baby's head pushing down and causing pelvic pressure.   °· Your baby is not moving as much as it used to.   °Document Released: 09/21/2005 Document Revised: 09/26/2013 Document Reviewed: 07/03/2013 °[employer]® Patient Information ©2015 [employer]. This information is not intended to replace advice given to you by your health care   provider. Make sure you discuss any questions you have with your health care provider. ° °

## 2015-04-24 NOTE — MAU Note (Signed)
Urine to be ordered for patient and patient to hydrate po and then be reexamined in an hour. Fabian NovemberM Bhambri CNM to bee called with results and reexamination.

## 2015-04-30 ENCOUNTER — Inpatient Hospital Stay (HOSPITAL_COMMUNITY)
Admission: AD | Admit: 2015-04-30 | Discharge: 2015-05-01 | Disposition: A | Discharge status: Home / Self Care | Payer: Medicaid Other | Source: Ambulatory Visit | Attending: Obstetrics and Gynecology | Admitting: Obstetrics and Gynecology

## 2015-04-30 ENCOUNTER — Encounter (HOSPITAL_COMMUNITY): Payer: Self-pay | Admitting: *Deleted

## 2015-04-30 DIAGNOSIS — Z3A37 37 weeks gestation of pregnancy: Secondary | ICD-10-CM | POA: Insufficient documentation

## 2015-04-30 NOTE — MAU Provider Note (Signed)
Presents with prodromal labor complaint NST reactive. Minimal cervical change. No evidence SROM per RN. Will give Ambien and likely dc home. Fu office as scheduled.

## 2015-04-30 NOTE — MAU Note (Addendum)
PT SAYS SHE STARTED  HURTING  BAD  WITH  DIARRHEA-  [place]   X2  .    THEN  HAD UC-  HURT AT 730PM.      DT  TAAVON- V E IN OFFICE - CLOSED.    DENIES HSV AND MRSA.    GBS-   POSITIVE.   SAYS BABY HAS  NOT  MOVED  SINCE  6 PM

## 2015-04-30 NOTE — Progress Notes (Signed)
Pt reports she had some leaking at 4-4:30pm, states she thought she was bleeding but looked and it was clear watery and went through underwear but has not had any leaking since.

## 2015-05-01 DIAGNOSIS — Z3A37 37 weeks gestation of pregnancy: Secondary | ICD-10-CM | POA: Diagnosis not present

## 2015-05-01 LAB — POCT FERN TEST: POCT Fern Test: NEGATIVE

## 2015-05-01 MED ORDER — ZOLPIDEM TARTRATE 5 MG PO TABS
5.0000 mg | ORAL_TABLET | Freq: Once | ORAL | Status: AC
  Administered 2015-05-01: 5 mg via ORAL
  Filled 2015-05-01: qty 1

## 2015-05-01 MED ORDER — ZOLPIDEM TARTRATE 5 MG PO TABS: 5.0000 mg | ORAL_TABLET | Freq: Once | ORAL | Status: DC

## 2015-05-01 NOTE — Discharge Instructions (Signed)
KEEP 7-28 appointment at office Third Trimester of Pregnancy The third trimester is from week 29 through week 42, months 7 through 9. This trimester is when your unborn baby (fetus) is growing very fast. At the end of the ninth month, the unborn baby is about 20 inches in length. It weighs about 6-10 pounds.  HOME CARE   Avoid all smoking, herbs, and alcohol. Avoid drugs not approved by your doctor.  Only take medicine as told by your doctor. Some medicines are safe and some are not during pregnancy.  Exercise only as told by your doctor. Stop exercising if you start having cramps.  Eat regular, healthy meals.  Wear a good support bra if your breasts are tender.  Do not use hot tubs, steam rooms, or saunas.  Wear your seat belt when driving.  Avoid raw meat, uncooked cheese, and liter boxes and soil used by cats.  Take your prenatal vitamins.  Try taking medicine that helps you poop (stool softener) as needed, and if your doctor approves. Eat more fiber by eating fresh fruit, vegetables, and whole grains. Drink enough fluids to keep your pee (urine) clear or pale yellow.  Take warm water baths (sitz baths) to soothe pain or discomfort caused by hemorrhoids. Use hemorrhoid cream if your doctor approves.  If you have puffy, bulging veins (varicose veins), wear support hose. Raise (elevate) your feet for 15 minutes, 3-4 times a day. Limit salt in your diet.  Avoid heavy lifting, wear low heels, and sit up straight.  Rest with your legs raised if you have leg cramps or low back pain.  Visit your dentist if you have not gone during your pregnancy. Use a soft toothbrush to brush your teeth. Be gentle when you floss.  You can have sex (intercourse) unless your doctor tells you not to.  Do not travel far distances unless you must. Only do so with your doctor's approval.  Take prenatal classes.  Practice driving to the hospital.  Pack your hospital bag.  Prepare the baby's  room.  Go to your doctor visits. GET HELP IF:  You are not sure if you are in labor or if your water has broken.  You are dizzy.  You have mild cramps or pressure in your lower belly (abdominal).  You have a nagging pain in your belly area.  You continue to feel sick to your stomach (nauseous), throw up (vomit), or have watery poop (diarrhea).  You have bad smelling fluid coming from your vagina.  You have pain with peeing (urination). GET HELP RIGHT AWAY IF:   You have a fever.  You are leaking fluid from your vagina.  You are spotting or bleeding from your vagina.  You have severe belly cramping or pain.  You lose or gain weight rapidly.  You have trouble catching your breath and have chest pain.  You notice sudden or extreme puffiness (swelling) of your face, hands, ankles, feet, or legs.  You have not felt the baby move in over an hour.  You have severe headaches that do not go away with medicine.  You have vision changes. Document Released: 12/16/2009 Document Revised: 01/16/2013 Document Reviewed: 11/22/2012 [facility] Patient Information 2015 [place], [place]. This information is not intended to replace advice given to you by your health care provider. Make sure you discuss any questions you have with your health care provider. Fetal Movement Counts Patient Name: __________________________________________________ Patient Due Date: ____________________ Performing a fetal movement count is highly recommended in high-risk  pregnancies, but it is good for every pregnant woman to do. Your health care provider may ask you to start counting fetal movements at 28 weeks of the pregnancy. Fetal movements often increase:  After eating a full meal.  After physical activity.  After eating or drinking something sweet or cold.  At rest. Pay attention to when you feel the baby is most active. This will help you notice a pattern of your baby's sleep and wake cycles and what  factors contribute to an increase in fetal movement. It is important to perform a fetal movement count at the same time each day when your baby is normally most active.  HOW TO COUNT FETAL MOVEMENTS  Find a quiet and comfortable area to sit or lie down on your left side. Lying on your left side provides the best blood and oxygen circulation to your baby.  Write down the day and time on a sheet of paper or in a journal.  Start counting kicks, flutters, swishes, rolls, or jabs in a 2-hour period. You should feel at least 10 movements within 2 hours.  If you do not feel 10 movements in 2 hours, wait 2-3 hours and count again. Look for a change in the pattern or not enough counts in 2 hours. SEEK MEDICAL CARE IF:  You feel less than 10 counts in 2 hours, tried twice.  There is no movement in over an hour.  The pattern is changing or taking longer each day to reach 10 counts in 2 hours.  You feel the baby is not moving as he or she usually does. Date: ____________ Movements: ____________ Start time: ____________ Elizebeth Koller time: ____________  Date: ____________ Movements: ____________ Start time: ____________ Elizebeth Koller time: ____________ Date: ____________ Movements: ____________ Start time: ____________ Elizebeth Koller time: ____________ Date: ____________ Movements: ____________ Start time: ____________ Elizebeth Koller time: ____________ Date: ____________ Movements: ____________ Start time: ____________ Elizebeth Koller time: ____________ Date: ____________ Movements: ____________ Start time: ____________ Elizebeth Koller time: ____________ Date: ____________ Movements: ____________ Start time: ____________ Elizebeth Koller time: ____________ Date: ____________ Movements: ____________ Start time: ____________ Elizebeth Koller time: ____________  Date: ____________ Movements: ____________ Start time: ____________ Elizebeth Koller time: ____________ Date: ____________ Movements: ____________ Start time: ____________ Elizebeth Koller time: ____________ Date: ____________  Movements: ____________ Start time: ____________ Elizebeth Koller time: ____________ Date: ____________ Movements: ____________ Start time: ____________ Elizebeth Koller time: ____________ Date: ____________ Movements: ____________ Start time: ____________ Elizebeth Koller time: ____________ Date: ____________ Movements: ____________ Start time: ____________ Elizebeth Koller time: ____________ Date: ____________ Movements: ____________ Start time: ____________ Elizebeth Koller time: ____________  Date: ____________ Movements: ____________ Start time: ____________ Elizebeth Koller time: ____________ Date: ____________ Movements: ____________ Start time: ____________ Elizebeth Koller time: ____________ Date: ____________ Movements: ____________ Start time: ____________ Elizebeth Koller time: ____________ Date: ____________ Movements: ____________ Start time: ____________ Elizebeth Koller time: ____________ Date: ____________ Movements: ____________ Start time: ____________ Elizebeth Koller time: ____________ Date: ____________ Movements: ____________ Start time: ____________ Elizebeth Koller time: ____________ Date: ____________ Movements: ____________ Start time: ____________ Elizebeth Koller time: ____________  Date: ____________ Movements: ____________ Start time: ____________ Elizebeth Koller time: ____________ Date: ____________ Movements: ____________ Start time: ____________ Elizebeth Koller time: ____________ Date: ____________ Movements: ____________ Start time: ____________ Elizebeth Koller time: ____________ Date: ____________ Movements: ____________ Start time: ____________ Elizebeth Koller time: ____________ Date: ____________ Movements: ____________ Start time: ____________ Elizebeth Koller time: ____________ Date: ____________ Movements: ____________ Start time: ____________ Elizebeth Koller time: ____________ Date: ____________ Movements: ____________ Start time: ____________ Elizebeth Koller time: ____________  Date: ____________ Movements: ____________ Start time: ____________ Elizebeth Koller time: ____________ Date: ____________ Movements: ____________ Start time:  ____________ Elizebeth Koller time: ____________ Date: ____________ Movements: ____________ Start time: ____________  Finish time: ____________ Date: ____________ Movements: ____________ Start time: ____________ Elizebeth Koller time: ____________ Date: ____________ Movements: ____________ Start time: ____________ Elizebeth Koller time: ____________ Date: ____________ Movements: ____________ Start time: ____________ Elizebeth Koller time: ____________ Date: ____________ Movements: ____________ Start time: ____________ Elizebeth Koller time: ____________  Date: ____________ Movements: ____________ Start time: ____________ Elizebeth Koller time: ____________ Date: ____________ Movements: ____________ Start time: ____________ Elizebeth Koller time: ____________ Date: ____________ Movements: ____________ Start time: ____________ Elizebeth Koller time: ____________ Date: ____________ Movements: ____________ Start time: ____________ Elizebeth Koller time: ____________ Date: ____________ Movements: ____________ Start time: ____________ Elizebeth Koller time: ____________ Date: ____________ Movements: ____________ Start time: ____________ Elizebeth Koller time: ____________ Date: ____________ Movements: ____________ Start time: ____________ Elizebeth Koller time: ____________  Date: ____________ Movements: ____________ Start time: ____________ Elizebeth Koller time: ____________ Date: ____________ Movements: ____________ Start time: ____________ Elizebeth Koller time: ____________ Date: ____________ Movements: ____________ Start time: ____________ Elizebeth Koller time: ____________ Date: ____________ Movements: ____________ Start time: ____________ Elizebeth Koller time: ____________ Date: ____________ Movements: ____________ Start time: ____________ Elizebeth Koller time: ____________ Date: ____________ Movements: ____________ Start time: ____________ Elizebeth Koller time: ____________ Date: ____________ Movements: ____________ Start time: ____________ Elizebeth Koller time: ____________  Date: ____________ Movements: ____________ Start time: ____________ Elizebeth Koller time: ____________ Date:  ____________ Movements: ____________ Start time: ____________ Elizebeth Koller time: ____________ Date: ____________ Movements: ____________ Start time: ____________ Elizebeth Koller time: ____________ Date: ____________ Movements: ____________ Start time: ____________ Elizebeth Koller time: ____________ Date: ____________ Movements: ____________ Start time: ____________ Elizebeth Koller time: ____________ Date: ____________ Movements: ____________ Start time: ____________ Elizebeth Koller time: ____________ Document Released: 10/21/2006 Document Revised: 02/05/2014 Document Reviewed: 07/18/2012 [employer] Patient Information 2015 [place] [employer]. This information is not intended to replace advice given to you by your health care provider. Make sure you discuss any questions you have with your health care provider. Braxton Hicks Contractions Contractions of the uterus can occur throughout pregnancy. Contractions are not always a sign that you are in labor.  WHAT ARE BRAXTON HICKS CONTRACTIONS?  Contractions that occur before labor are called Braxton Hicks contractions, or false labor. Toward the end of pregnancy (32-34 weeks), these contractions can develop more often and may become more forceful. This is not true labor because these contractions do not result in opening (dilatation) and thinning of the cervix. They are sometimes difficult to tell apart from true labor because these contractions can be forceful and people have different pain tolerances. You should not feel embarrassed if you go to the hospital with false labor. Sometimes, the only way to tell if you are in true labor is for your health care provider to look for changes in the cervix. If there are no prenatal problems or other health problems associated with the pregnancy, it is completely safe to be sent home with false labor and await the onset of true labor. HOW CAN YOU TELL THE DIFFERENCE BETWEEN TRUE AND FALSE LABOR? False Labor  The contractions of false labor are usually shorter  and not as hard as those of true labor.   The contractions are usually irregular.   The contractions are often felt in the front of the lower abdomen and in the groin.   The contractions may go away when you walk around or change positions while lying down.   The contractions get weaker and are shorter lasting as time goes on.   The contractions do not usually become progressively stronger, regular, and closer together as with true labor.  True Labor  Contractions in true labor last 30-70 seconds, become very regular, usually become more intense, and increase in frequency.   The contractions do not go away with  walking.   The discomfort is usually felt in the top of the uterus and spreads to the lower abdomen and low back.   True labor can be determined by your health care provider with an exam. This will show that the cervix is dilating and getting thinner.  WHAT TO REMEMBER  Keep up with your usual exercises and follow other instructions given by your health care provider.   Take medicines as directed by your health care provider.   Keep your regular prenatal appointments.   Eat and drink lightly if you think you are going into labor.   If Braxton Hicks contractions are making you uncomfortable:   Change your position from lying down or resting to walking, or from walking to resting.   Sit and rest in a tub of warm water.   Drink 2-3 glasses of water. Dehydration may cause these contractions.   Do slow and deep breathing several times an hour.  WHEN SHOULD I SEEK IMMEDIATE MEDICAL CARE? Seek immediate medical care if:  Your contractions become stronger, more regular, and closer together.   You have fluid leaking or gushing from your vagina.   You have a fever.   You pass blood-tinged mucus.   You have vaginal bleeding.   You have continuous abdominal pain.   You have low back pain that you never had before.   You feel your baby's  head pushing down and causing pelvic pressure.   Your baby is not moving as much as it used to.  Document Released: 09/21/2005 Document Revised: 09/26/2013 Document Reviewed: 07/03/2013 [facility] Patient Information 2015 [place], [place]. This information is not intended to replace advice given to you by your health care provider. Make sure you discuss any questions you have with your health care provider.

## 2015-05-05 ENCOUNTER — Inpatient Hospital Stay (HOSPITAL_COMMUNITY): Payer: Medicaid Other

## 2015-05-05 ENCOUNTER — Inpatient Hospital Stay (HOSPITAL_COMMUNITY)
Admission: AD | Admit: 2015-05-05 | Discharge: 2015-05-08 | DRG: 775 | Disposition: A | Discharge status: Home / Self Care | Payer: Medicaid Other | Source: Ambulatory Visit | Attending: Obstetrics and Gynecology | Admitting: Obstetrics and Gynecology

## 2015-05-05 ENCOUNTER — Encounter (HOSPITAL_COMMUNITY): Payer: Self-pay | Admitting: *Deleted

## 2015-05-05 DIAGNOSIS — O9081 Anemia of the puerperium: Secondary | ICD-10-CM | POA: Diagnosis present

## 2015-05-05 DIAGNOSIS — O2603 Excessive weight gain in pregnancy, third trimester: Secondary | ICD-10-CM | POA: Diagnosis present

## 2015-05-05 DIAGNOSIS — O99824 Streptococcus B carrier state complicating childbirth: Secondary | ICD-10-CM | POA: Diagnosis present

## 2015-05-05 DIAGNOSIS — O288 Other abnormal findings on antenatal screening of mother: Secondary | ICD-10-CM

## 2015-05-05 DIAGNOSIS — D509 Iron deficiency anemia, unspecified: Secondary | ICD-10-CM | POA: Diagnosis present

## 2015-05-05 DIAGNOSIS — Z3A38 38 weeks gestation of pregnancy: Secondary | ICD-10-CM | POA: Diagnosis present

## 2015-05-05 DIAGNOSIS — O403XX0 Polyhydramnios, third trimester, not applicable or unspecified: Secondary | ICD-10-CM | POA: Diagnosis present

## 2015-05-05 DIAGNOSIS — D62 Acute posthemorrhagic anemia: Secondary | ICD-10-CM | POA: Diagnosis present

## 2015-05-05 DIAGNOSIS — Z87891 Personal history of nicotine dependence: Secondary | ICD-10-CM

## 2015-05-05 DIAGNOSIS — Z3A37 37 weeks gestation of pregnancy: Secondary | ICD-10-CM | POA: Diagnosis present

## 2015-05-05 HISTORY — DX: Personal history of other infectious and parasitic diseases: Z86.19

## 2015-05-05 HISTORY — DX: Iron deficiency anemia, unspecified: D50.9

## 2015-05-05 HISTORY — DX: Personal history of nicotine dependence: Z87.891

## 2015-05-05 HISTORY — DX: Acute posthemorrhagic anemia: D62

## 2015-05-05 HISTORY — DX: Partial loss of teeth, unspecified cause, unspecified class: K08.409

## 2015-05-05 MED ORDER — ZOLPIDEM TARTRATE 5 MG PO TABS
5.0000 mg | ORAL_TABLET | Freq: Once | ORAL | Status: AC
  Administered 2015-05-05: 5 mg via ORAL
  Filled 2015-05-05: qty 1

## 2015-05-05 NOTE — MAU Note (Signed)
Pt presents complaining of worsening contractions. Denies leaking of fluid.

## 2015-05-06 ENCOUNTER — Encounter (HOSPITAL_COMMUNITY): Payer: Self-pay | Admitting: *Deleted

## 2015-05-06 ENCOUNTER — Inpatient Hospital Stay (HOSPITAL_COMMUNITY): Payer: Medicaid Other | Admitting: Anesthesiology

## 2015-05-06 DIAGNOSIS — Z3A37 37 weeks gestation of pregnancy: Secondary | ICD-10-CM | POA: Diagnosis present

## 2015-05-06 DIAGNOSIS — D62 Acute posthemorrhagic anemia: Secondary | ICD-10-CM | POA: Diagnosis present

## 2015-05-06 DIAGNOSIS — O99824 Streptococcus B carrier state complicating childbirth: Secondary | ICD-10-CM | POA: Diagnosis present

## 2015-05-06 DIAGNOSIS — O9081 Anemia of the puerperium: Secondary | ICD-10-CM | POA: Diagnosis present

## 2015-05-06 DIAGNOSIS — O2603 Excessive weight gain in pregnancy, third trimester: Secondary | ICD-10-CM | POA: Diagnosis present

## 2015-05-06 DIAGNOSIS — O403XX0 Polyhydramnios, third trimester, not applicable or unspecified: Secondary | ICD-10-CM | POA: Diagnosis present

## 2015-05-06 DIAGNOSIS — Z87891 Personal history of nicotine dependence: Secondary | ICD-10-CM | POA: Diagnosis not present

## 2015-05-06 DIAGNOSIS — Z3A38 38 weeks gestation of pregnancy: Secondary | ICD-10-CM | POA: Diagnosis present

## 2015-05-06 DIAGNOSIS — O288 Other abnormal findings on antenatal screening of mother: Secondary | ICD-10-CM | POA: Diagnosis not present

## 2015-05-06 LAB — TYPE AND SCREEN
ABO/RH(D): A POS
ANTIBODY SCREEN: NEGATIVE

## 2015-05-06 LAB — CBC
HCT: 27.7 % — ABNORMAL LOW (ref 36.0–46.0)
Hemoglobin: 8.3 g/dL — ABNORMAL LOW (ref 12.0–15.0)
MCH: 22.4 pg — ABNORMAL LOW (ref 26.0–34.0)
MCHC: 30 g/dL (ref 30.0–36.0)
MCV: 74.7 fL — ABNORMAL LOW (ref 78.0–100.0)
Platelets: 339 10*3/uL (ref 150–400)
RBC: 3.71 MIL/uL — AB (ref 3.87–5.11)
RDW: 16.5 % — ABNORMAL HIGH (ref 11.5–15.5)
WBC: 10.5 10*3/uL (ref 4.0–10.5)

## 2015-05-06 LAB — RPR: RPR: NONREACTIVE

## 2015-05-06 LAB — ABO/RH: ABO/RH(D): A POS

## 2015-05-06 MED ORDER — WITCH HAZEL-GLYCERIN EX PADS: 1.0000 "application " | MEDICATED_PAD | CUTANEOUS | Status: DC | PRN

## 2015-05-06 MED ORDER — TERBUTALINE SULFATE 1 MG/ML IJ SOLN
0.2500 mg | Freq: Once | INTRAMUSCULAR | Status: AC
  Administered 2015-05-06: 0.25 mg via SUBCUTANEOUS

## 2015-05-06 MED ORDER — BENZOCAINE-MENTHOL 20-0.5 % EX AERO
1.0000 "application " | INHALATION_SPRAY | CUTANEOUS | Status: DC | PRN
  Administered 2015-05-06: 1 via TOPICAL
  Filled 2015-05-06 (×2): qty 56

## 2015-05-06 MED ORDER — OXYCODONE-ACETAMINOPHEN 5-325 MG PO TABS: 2.0000 | ORAL_TABLET | ORAL | Status: DC | PRN

## 2015-05-06 MED ORDER — PENICILLIN G POTASSIUM 5000000 UNITS IJ SOLR
5.0000 10*6.[IU] | Freq: Once | INTRAVENOUS | Status: AC
  Administered 2015-05-06: 5 10*6.[IU] via INTRAVENOUS
  Filled 2015-05-06: qty 5

## 2015-05-06 MED ORDER — CITRIC ACID-SODIUM CITRATE 334-500 MG/5ML PO SOLN: 30.0000 mL | ORAL | Status: DC | PRN

## 2015-05-06 MED ORDER — EPHEDRINE 5 MG/ML INJ
10.0000 mg | INTRAVENOUS | Status: DC | PRN
  Filled 2015-05-06: qty 2

## 2015-05-06 MED ORDER — FLEET ENEMA 7-19 GM/118ML RE ENEM: 1.0000 | ENEMA | RECTAL | Status: DC | PRN

## 2015-05-06 MED ORDER — ACETAMINOPHEN 325 MG PO TABS
650.0000 mg | ORAL_TABLET | ORAL | Status: DC | PRN
  Administered 2015-05-06: 650 mg via ORAL
  Filled 2015-05-06: qty 2

## 2015-05-06 MED ORDER — ONDANSETRON HCL 4 MG/2ML IJ SOLN: 4.0000 mg | Freq: Four times a day (QID) | INTRAMUSCULAR | Status: DC | PRN

## 2015-05-06 MED ORDER — OXYCODONE-ACETAMINOPHEN 5-325 MG PO TABS: 1.0000 | ORAL_TABLET | ORAL | Status: DC | PRN

## 2015-05-06 MED ORDER — TERBUTALINE SULFATE 1 MG/ML IJ SOLN
0.2500 mg | Freq: Once | INTRAMUSCULAR | Status: DC | PRN
  Filled 2015-05-06: qty 1

## 2015-05-06 MED ORDER — OXYTOCIN BOLUS FROM INFUSION: 500.0000 mL | INTRAVENOUS | Status: DC

## 2015-05-06 MED ORDER — IBUPROFEN 600 MG PO TABS
600.0000 mg | ORAL_TABLET | Freq: Four times a day (QID) | ORAL | Status: DC
  Administered 2015-05-06 – 2015-05-08 (×8): 600 mg via ORAL
  Filled 2015-05-06 (×8): qty 1

## 2015-05-06 MED ORDER — DIPHENHYDRAMINE HCL 50 MG/ML IJ SOLN: 12.5000 mg | INTRAMUSCULAR | Status: DC | PRN

## 2015-05-06 MED ORDER — LIDOCAINE HCL (PF) 1 % IJ SOLN
30.0000 mL | INTRAMUSCULAR | Status: DC | PRN
  Filled 2015-05-06: qty 30

## 2015-05-06 MED ORDER — ACETAMINOPHEN 325 MG PO TABS: 650.0000 mg | ORAL_TABLET | ORAL | Status: DC | PRN

## 2015-05-06 MED ORDER — METHYLERGONOVINE MALEATE 0.2 MG PO TABS: 0.2000 mg | ORAL_TABLET | ORAL | Status: DC | PRN

## 2015-05-06 MED ORDER — PHENYLEPHRINE 40 MCG/ML (10ML) SYRINGE FOR IV PUSH (FOR BLOOD PRESSURE SUPPORT)
80.0000 ug | PREFILLED_SYRINGE | INTRAVENOUS | Status: DC | PRN
  Filled 2015-05-06: qty 2
  Filled 2015-05-06: qty 20

## 2015-05-06 MED ORDER — LACTATED RINGERS IV SOLN
INTRAVENOUS | Status: DC
  Administered 2015-05-06: 08:00:00 via INTRAVENOUS

## 2015-05-06 MED ORDER — LIDOCAINE HCL (PF) 1 % IJ SOLN
INTRAMUSCULAR | Status: DC | PRN
  Administered 2015-05-06 (×2): 8 mL via EPIDURAL

## 2015-05-06 MED ORDER — SENNOSIDES-DOCUSATE SODIUM 8.6-50 MG PO TABS
2.0000 | ORAL_TABLET | ORAL | Status: DC
  Administered 2015-05-06 – 2015-05-08 (×2): 2 via ORAL
  Filled 2015-05-06 (×2): qty 2

## 2015-05-06 MED ORDER — OXYTOCIN 40 UNITS IN LACTATED RINGERS INFUSION - SIMPLE MED
1.0000 m[IU]/min | INTRAVENOUS | Status: DC
Start: 2015-05-06 — End: 2015-05-06
  Administered 2015-05-06: 2 m[IU]/min via INTRAVENOUS

## 2015-05-06 MED ORDER — TETANUS-DIPHTH-ACELL PERTUSSIS 5-2.5-18.5 LF-MCG/0.5 IM SUSP: 0.5000 mL | Freq: Once | INTRAMUSCULAR | Status: DC

## 2015-05-06 MED ORDER — PENICILLIN G POTASSIUM 5000000 UNITS IJ SOLR
2.5000 10*6.[IU] | INTRAVENOUS | Status: DC
  Administered 2015-05-06 (×2): 2.5 10*6.[IU] via INTRAVENOUS
  Filled 2015-05-06 (×6): qty 2.5

## 2015-05-06 MED ORDER — DIPHENHYDRAMINE HCL 25 MG PO CAPS: 25.0000 mg | ORAL_CAPSULE | Freq: Four times a day (QID) | ORAL | Status: DC | PRN

## 2015-05-06 MED ORDER — ONDANSETRON HCL 4 MG PO TABS: 4.0000 mg | ORAL_TABLET | ORAL | Status: DC | PRN

## 2015-05-06 MED ORDER — ZOLPIDEM TARTRATE 5 MG PO TABS: 5.0000 mg | ORAL_TABLET | Freq: Every evening | ORAL | Status: DC | PRN

## 2015-05-06 MED ORDER — SIMETHICONE 80 MG PO CHEW
80.0000 mg | CHEWABLE_TABLET | ORAL | Status: DC | PRN
Start: 2015-05-06 — End: 2015-05-08

## 2015-05-06 MED ORDER — DIBUCAINE 1 % RE OINT: 1.0000 "application " | TOPICAL_OINTMENT | RECTAL | Status: DC | PRN

## 2015-05-06 MED ORDER — TERBUTALINE SULFATE 1 MG/ML IJ SOLN
INTRAMUSCULAR | Status: AC
  Administered 2015-05-06: 0.25 mg via SUBCUTANEOUS
  Filled 2015-05-06: qty 1

## 2015-05-06 MED ORDER — FENTANYL 2.5 MCG/ML BUPIVACAINE 1/10 % EPIDURAL INFUSION (WH - ANES)
14.0000 mL/h | INTRAMUSCULAR | Status: DC | PRN
  Administered 2015-05-06 (×2): 14 mL/h via EPIDURAL
  Filled 2015-05-06 (×2): qty 125

## 2015-05-06 MED ORDER — PRENATAL MULTIVITAMIN CH
1.0000 | ORAL_TABLET | Freq: Every day | ORAL | Status: DC
  Administered 2015-05-07 – 2015-05-08 (×2): 1 via ORAL
  Filled 2015-05-06 (×2): qty 1

## 2015-05-06 MED ORDER — OXYTOCIN 40 UNITS IN LACTATED RINGERS INFUSION - SIMPLE MED
62.5000 mL/h | INTRAVENOUS | Status: DC
  Filled 2015-05-06: qty 1000

## 2015-05-06 MED ORDER — METHYLERGONOVINE MALEATE 0.2 MG/ML IJ SOLN: 0.2000 mg | INTRAMUSCULAR | Status: DC | PRN

## 2015-05-06 MED ORDER — ONDANSETRON HCL 4 MG/2ML IJ SOLN: 4.0000 mg | INTRAMUSCULAR | Status: DC | PRN

## 2015-05-06 MED ORDER — LANOLIN HYDROUS EX OINT: TOPICAL_OINTMENT | CUTANEOUS | Status: DC | PRN

## 2015-05-06 MED ORDER — LACTATED RINGERS IV SOLN
500.0000 mL | INTRAVENOUS | Status: DC | PRN
  Administered 2015-05-06 (×3): 500 mL via INTRAVENOUS

## 2015-05-06 NOTE — Anesthesia Procedure Notes (Signed)
Epidural Patient location during procedure: OB Start time: 05/06/2015 2:15 AM End time: 05/06/2015 2:19 AM  Staffing Anesthesiologist: Leilani Able Performed by: anesthesiologist   Preanesthetic Checklist Completed: patient identified, surgical consent, pre-op evaluation, timeout performed, IV checked, risks and benefits discussed and monitors and equipment checked  Epidural Patient position: sitting Prep: site prepped and draped and DuraPrep Patient monitoring: continuous pulse ox and blood pressure Approach: midline Location: L3-L4 Injection technique: LOR air  Needle:  Needle type: Tuohy  Needle gauge: 17 G Needle length: 9 cm and 9 Needle insertion depth: 5 cm cm Catheter type: closed end flexible Catheter size: 19 Gauge Catheter at skin depth: 10 cm Test dose: negative and Other  Assessment Sensory level: T9 Events: blood not aspirated, injection not painful, no injection resistance, negative IV test and no paresthesia

## 2015-05-06 NOTE — Anesthesia Preprocedure Evaluation (Signed)
Anesthesia Evaluation  Patient identified by MRN, date of birth, ID band Patient awake    Reviewed: Allergy & Precautions, H&P , NPO status , Patient's Chart, lab work & pertinent test results  Airway Mallampati: I  TM Distance: >3 FB Neck ROM: full    Dental no notable dental hx.    Pulmonary former smoker,    Pulmonary exam normal       Cardiovascular negative cardio ROS Normal cardiovascular exam    Neuro/Psych negative psych ROS   GI/Hepatic negative GI ROS, Neg liver ROS,   Endo/Other  negative endocrine ROS  Renal/GU negative Renal ROS     Musculoskeletal   Abdominal Normal abdominal exam  (+)   Peds  Hematology   Anesthesia Other Findings   Reproductive/Obstetrics (+) Pregnancy                             Anesthesia Physical Anesthesia Plan  ASA: II  Anesthesia Plan: Epidural   Post-op Pain Management:    Induction:   Airway Management Planned:   Additional Equipment:   Intra-op Plan:   Post-operative Plan:   Informed Consent: I have reviewed the patients History and Physical, chart, labs and discussed the procedure including the risks, benefits and alternatives for the proposed anesthesia with the patient or authorized representative who has indicated his/her understanding and acceptance.     Plan Discussed with:   Anesthesia Plan Comments:         Anesthesia Quick Evaluation  

## 2015-05-06 NOTE — Progress Notes (Signed)
Lisa Hester is a 19 y.o. G2P0010 at [redacted]w[redacted]d by LMP admitted for active labor, rupture of membranes  Subjective: comfortable  Objective: BP 120/74 mmHg  Pulse 106  Temp(Src) 98.8 F (37.1 C) (Oral)  Resp 18  Ht  (1.626 m)  Wt 74.844 kg (165 lb)  BMI 28.31 kg/m2  SpO2 100%  LMP 08/15/2014 (Exact Date) I/O last 3 completed shifts: In: -  Out: 300 [Urine:300]    FHT:  FHR: 145 bpm, variability: moderate,  accelerations:  Present,  decelerations:  Present intermittent variable , rare late UC:   irregular, every 2-5 minutes adequate MVU 155  SVE:   5+/90/0  Labs: Lab Results  Component Value Date   WBC 10.5 05/06/2015   HGB 8.3* 05/06/2015   HCT 27.7* 05/06/2015   MCV 74.7* 05/06/2015   PLT 339 05/06/2015    Assessment / Plan: Protracted active phase  Labor: Progressing normally Preeclampsia:  no signs or symptoms of toxicity Fetal Wellbeing:  Category I and Category II Pain Control:  Epidural I/D:  n/a Anticipated MOD:  guarded  Racer Quam J 05/06/2015, 8:33 AM

## 2015-05-06 NOTE — Lactation Note (Signed)
This note was copied from the chart of Lisa Hester. Lactation Consultation Note Initial visit at 7 hours of age.  Mom reports baby had a few feedings and then was just on her nipple.  Mom had a pacifier at bedside and encouraged mom to not use until breast feeding was well established and explained reasons.  Encouraged mom to keep baby awake and active for feedings with good deep latch.  If baby is not active for feeding encouraged STS with baby.  Baby is getting bath at bedside.  Mom denies other concerns at this time.  [facility] LC resources given and discussed.  Encouraged to feed with early cues on demand.  Early newborn behavior discussed.  Hand expression reported by mom with colostrum visible.  Mom to call for assist as needed.    Patient Name: Lisa Jamieka Royle [id number] Date: 05/06/2015 Reason for consult: Initial assessment   Maternal Data Has patient been taught Hand Expression?: Yes Does the patient have breastfeeding experience prior to this delivery?: No  Feeding    LATCH Score/Interventions                Intervention(s): Breastfeeding basics reviewed     Lactation Tools Discussed/Used     Consult Status Consult Status: Follow-up Date: 05/07/15 Follow-up type: In-patient    Beverely Risen Arvella Merles 05/06/2015, 8:33 PM

## 2015-05-06 NOTE — Progress Notes (Addendum)
Lisa Hester is a 19 y.o. G2P0010 at [redacted]w[redacted]d by LMP admitted for rupture of membranes  Subjective: Comfortable with epidural Had ambien earlier   Objective: BP 105/62 mmHg  Pulse 84  Temp(Src) 98.6 F (37 C) (Oral)  Resp 18  Ht  (1.626 m)  Wt 74.844 kg (165 lb)  BMI 28.31 kg/m2  SpO2 100%  LMP 08/15/2014 (Exact Date)      FHT:  FHR: 140-150 bpm, variability: moderate,  accelerations:  Present,  decelerations:  Present occ late, occ early UC:   regular, every 1 minutes SVE:   Dilation: 4 Effacement (%): 70 Station: -1 Exam by:: Lisa Charity RN   Labs: Lab Results  Component Value Date   WBC 10.5 05/06/2015   HGB 8.3* 05/06/2015   HCT 27.7* 05/06/2015   MCV 74.7* 05/06/2015   PLT 339 05/06/2015    Assessment / Plan: Spontaneous labor, progressing normally  Tachysystole with intermittent decels  Labor: Progressing normally Preeclampsia:  no signs or symptoms of toxicity Fetal Wellbeing:  Category I and Category II Pain Control:  Epidural I/D:  n/a Anticipated MOD:  guarded  Will give dose of Terbutaline if unresolved  Lisa Hester J 05/06/2015, 4:51 AM

## 2015-05-06 NOTE — Progress Notes (Signed)
Lisa Hester is a 19 y.o. G2P0010 at [redacted]w[redacted]d by LMP admitted for rupture of membranes  Subjective: comfortable  Objective: BP 117/45 mmHg  Pulse 126  Temp(Src) 98.8 F (37.1 C) (Oral)  Resp 18  Ht  (1.626 m)  Wt 74.844 kg (165 lb)  BMI 28.31 kg/m2  SpO2 100%  LMP 08/15/2014 (Exact Date)   Total I/O In: -  Out: 300 [Urine:300]  FHT:  FHR: 145 bpm, variability: moderate,  accelerations:  Present,  decelerations:  Present intermittent early, occ variable UC:   irregular, every 5 minutes SVE:   Dilation: 4 Effacement (%): 70 Station: -1 Exam by:: Manning Charity RN   Labs: Lab Results  Component Value Date   WBC 10.5 05/06/2015   HGB 8.3* 05/06/2015   HCT 27.7* 05/06/2015   MCV 74.7* 05/06/2015   PLT 339 05/06/2015    Assessment / Plan: Protracted active phase  Labor: Progressing normally Preeclampsia:  no signs or symptoms of toxicity Fetal Wellbeing:  Category I and Category II Pain Control:  Epidural I/D:  n/a Anticipated MOD:  guarded  Lisa Hester J 05/06/2015, 7:00 AM

## 2015-05-06 NOTE — Progress Notes (Signed)
Phoned Lisa Hester to inform him of terbutaline use, uterine contractions, continued repetitive variable and late decelerations, and min to moderate variability.

## 2015-05-06 NOTE — H&P (Signed)
Angeliki Mates is a 19 y.o. female presenting for SROM at term. Maternal Medical History:  Reason for admission: Rupture of membranes.   Contractions: Onset was 1-2 hours ago.   Frequency: irregular.   Perceived severity is mild.    Fetal activity: Perceived fetal activity is normal.    Prenatal complications: Polyhydramnios.   Prenatal Complications - Diabetes: none.    OB History    Gravida Para Term Preterm AB TAB SAB Ectopic Multiple Living   0     Past Medical History  Diagnosis Date  . Headache   . Anemia    Past Surgical History  Procedure Laterality Date  . Wisdom tooth extraction     Family History: family history is not on file. Social History:  reports that she has quit smoking. Her smoking use included Cigars. She has never used smokeless tobacco. She reports that she does not drink alcohol or use illicit drugs.   Prenatal Transfer Tool  Maternal Diabetes: No Genetic Screening: Normal Maternal Ultrasounds/Referrals: Normal Fetal Ultrasounds or other Referrals:  None Maternal Substance Abuse:  No Significant Maternal Medications:  None Significant Maternal Lab Results:  None Other Comments:  None  Review of Systems  Constitutional: Negative.   All other systems reviewed and are negative.   Dilation: 2 Effacement (%): 50 Station: -3 Exam by:: K. WeissRN Blood pressure 131/85, pulse 95, temperature 98.6 F (37 C), temperature source Oral, resp. rate 18, height  (1.626 m), weight 74.844 kg (165 lb), last menstrual period 08/15/2014, SpO2 100 %. Maternal Exam:  Uterine Assessment: Contraction strength is mild.  Contraction frequency is irregular.   Abdomen: Patient reports no abdominal tenderness. Fetal presentation: vertex  Introitus: Normal vulva. Normal vagina.  Ferning test: positive.  Nitrazine test: positive. Amniotic fluid character: clear.  Pelvis: questionable for delivery.   Cervix: Cervix evaluated by digital exam.      Physical Exam  Nursing note and vitals reviewed. Constitutional: She appears well-developed and well-nourished.  Neck: Normal range of motion.  Cardiovascular: Normal rate and regular rhythm.   Respiratory: Effort normal and breath sounds normal.  GI: Soft. Bowel sounds are normal.  Genitourinary: Vagina normal and uterus normal.  Musculoskeletal: Normal range of motion.  Neurological: She is alert.    Prenatal labs: ABO, Rh: --/--/A POS (08/01 0100) Antibody: NEG (08/01 0100) Rubella: 2.67 (12/15 1555) RPR: NON REAC (12/15 1555)  HBsAg: NEGATIVE (12/15 1555)  HIV: NONREACTIVE (12/15 1555)  GBS: Positive (07/13 0000)   Assessment/Plan: SROM at term Admit   Ketan Renz J 05/06/2015, 2:25 AM

## 2015-05-07 ENCOUNTER — Encounter (HOSPITAL_COMMUNITY): Payer: Self-pay | Admitting: Obstetrics and Gynecology

## 2015-05-07 DIAGNOSIS — D509 Iron deficiency anemia, unspecified: Secondary | ICD-10-CM | POA: Diagnosis present

## 2015-05-07 DIAGNOSIS — D62 Acute posthemorrhagic anemia: Secondary | ICD-10-CM | POA: Diagnosis not present

## 2015-05-07 HISTORY — DX: Acute posthemorrhagic anemia: D62

## 2015-05-07 HISTORY — DX: Iron deficiency anemia, unspecified: D50.9

## 2015-05-07 LAB — CBC
HCT: 20 % — ABNORMAL LOW (ref 36.0–46.0)
Hemoglobin: 6.1 g/dL — CL (ref 12.0–15.0)
MCH: 22.7 pg — ABNORMAL LOW (ref 26.0–34.0)
MCHC: 30.5 g/dL (ref 30.0–36.0)
MCV: 74.3 fL — AB (ref 78.0–100.0)
Platelets: 232 10*3/uL (ref 150–400)
RBC: 2.69 MIL/uL — ABNORMAL LOW (ref 3.87–5.11)
RDW: 17 % — AB (ref 11.5–15.5)
WBC: 14.5 10*3/uL — ABNORMAL HIGH (ref 4.0–10.5)

## 2015-05-07 MED ORDER — MAGNESIUM OXIDE 400 (241.3 MG) MG PO TABS
200.0000 mg | ORAL_TABLET | Freq: Every day | ORAL | Status: DC
  Administered 2015-05-07: 200 mg via ORAL
  Filled 2015-05-07 (×2): qty 0.5

## 2015-05-07 MED ORDER — POLYSACCHARIDE IRON COMPLEX 150 MG PO CAPS
150.0000 mg | ORAL_CAPSULE | Freq: Two times a day (BID) | ORAL | Status: DC
  Administered 2015-05-07 – 2015-05-08 (×3): 150 mg via ORAL
  Filled 2015-05-07 (×3): qty 1

## 2015-05-07 NOTE — Anesthesia Postprocedure Evaluation (Signed)
Anesthesia Post Note  Patient: Lisa Hester  Procedure(s) Performed: * No procedures listed *  Anesthesia type: Epidural  Patient location: Mother/Baby  Post pain: Pain level controlled  Post assessment: Post-op Vital signs reviewed  Last Vitals:  Filed Vitals:   05/07/15 0627  BP: 101/81  Pulse: 97  Temp: 36.9 C  Resp: 20    Post vital signs: Reviewed  Level of consciousness:alert  Complications: No apparent anesthesia complications

## 2015-05-07 NOTE — Progress Notes (Signed)
Bhambri, CNM called and notified of pt's critical Hgb result from this morning, notified pt asymptomatic and VSS, no new orders received at this time.

## 2015-05-07 NOTE — Progress Notes (Addendum)
Patient ID: Lisa Hester, female   DOB: 04-Nov-1995, 19 y.o.   MRN: [id number] PPD # 1 SVD / Episiotomy / Shoulder Dystocia  S:  Reports feeling well             Tolerating po/ No nausea or vomiting             Bleeding is light             Pain controlled with ibuprofen (OTC)             Up ad lib / ambulatory / voiding without difficulties    Newborn  Information for the patient's newborn:  Lisa, Hester [[id number]]  female   breast feeding  / Circumcision planning outpatient   O:  A & O x 3, in no apparent distress              VS:  Filed Vitals:   05/06/15 1530 05/06/15 1630 05/06/15 2025 05/07/15 0627  BP: 124/73 120/63 131/85 101/81  Pulse: 100 105 92 97  Temp: 99.2 F (37.3 C) 99.2 F (37.3 C) 98.8 F (37.1 C) 98.5 F (36.9 C)  TempSrc: Oral Oral Oral Oral  Resp: Height:      Weight:      SpO2:        LABS:   Recent Labs  05/06/15 0100 05/07/15 0533  WBC 10.5 14.5*  HGB 8.3* 6.1*  HCT 27.7* 20.0*  PLT 339 232    Blood type: A POS (08/01 0100)  Rubella: 2.67 (12/15 1555)   I&O: I/O last 3 completed shifts: In: -  Out: 1508 [Urine:1000; Blood:508]             Lungs: Clear and unlabored  Heart: regular rate and rhythm / no murmurs  Abdomen: soft, non-tender, non-distended              Fundus: firm, non-tender, U-1  Perineum: Median episiotomy repair healing well - moderate edema  Lochia: minimal  Extremities: No edema, no calf pain or tenderness, No Homans    A/P: PPD # 1  19 y.o., [id number]   Principal Problem:    Postpartum care following vaginal delivery (8/1)  Active Problems:    Normal labor    Non-reactive NST (non-stress test)    [redacted] weeks gestation of pregnancy  IDA with compounding ABL Anemia   Doing well - stable status  Routine post partum orders  Start Niferex 150 mg BID  Start Magnesium Oxide 200 mg daily  Anticipate discharge tomorrow    Raelyn Mora, M, MSN, CNM 05/07/2015, 8:54 AM

## 2015-05-07 NOTE — Lactation Note (Signed)
This note was copied from the chart of Boy Jordyan Hardiman. Lactation Consultation Note  Patient Name: Boy Chlora Mcbain [id number] Date: 05/07/2015 Reason for consult: Follow-up assessment   With this  Mom ot a 37 5/[redacted] week gestation baby, weighing 7 lbs 4.6 oz. Mom had the baby latched in cradle hold, and baby laying across her chest to her lap. Mom agreed to allow me to reposition the baby in cradle hold, with pillows for support. Mom initially had sore nipple, but once  Nipple was stretched, mom said the latch was comfortable. Mom to call for assist with latching as needed.    Maternal Data    Feeding Feeding Type: Breast Fed Length of feed: 20 min  LATCH Score/Interventions Latch: Repeated attempts needed to sustain latch, nipple held in mouth throughout feeding, stimulation needed to elicit sucking reflex. Intervention(s): Assist with latch;Adjust position;Breast compression  Audible Swallowing: Spontaneous and intermittent  Type of Nipple: Everted at rest and after stimulation (shosrt shafted, compressible tissue, baby pulls breast into his mouth for a deep latchc)  Comfort (Breast/Nipple): Soft / non-tender     Hold (Positioning): Assistance needed to correctly position infant at breast and maintain latch. Intervention(s): Breastfeeding basics reviewed;Support Pillows;Position options;Skin to skin  LATCH Score: 8  Lactation Tools Discussed/Used     Consult Status Consult Status: Follow-up Date: 05/07/15 Follow-up type: In-patient    Alfred Levins 05/07/2015, 10:37 AM

## 2015-05-08 MED ORDER — IBUPROFEN 600 MG PO TABS: 600.0000 mg | ORAL_TABLET | Freq: Four times a day (QID) | ORAL | Status: AC

## 2015-05-08 MED ORDER — MAGNESIUM OXIDE 400 (241.3 MG) MG PO TABS: 200.0000 mg | ORAL_TABLET | Freq: Every day | ORAL | Status: AC

## 2015-05-08 MED ORDER — POLYSACCHARIDE IRON COMPLEX 150 MG PO CAPS: 150.0000 mg | ORAL_CAPSULE | Freq: Two times a day (BID) | ORAL | Status: AC

## 2015-05-08 NOTE — Progress Notes (Signed)
PPD #2- SVD  Subjective:   Reports feeling well, ready for discharge Tolerating po/ No nausea or vomiting No dizziness or SOB Bleeding is light Pain controlled with Motrin Up ad lib / ambulatory / voiding without problems Newborn: breastfeeding  / Circumcision: planning outpt   Objective:   VS: VS:  Filed Vitals:   05/06/15 2025 05/07/15 0627 05/07/15 1846 05/08/15 0633  BP: 131/85 101/81 125/82 121/67  Pulse: 92 97 93 101  Temp: 98.8 F (37.1 C) 98.5 F (36.9 C) 98.5 F (36.9 C) 98.4 F (36.9 C)  TempSrc: Oral Oral Oral Oral  Resp: Height:      Weight:      SpO2:        LABS:  Recent Labs  05/06/15 0100 05/07/15 0533  WBC 10.5 14.5*  HGB 8.3* 6.1*  PLT 339 232   Blood type: --/--/A POS, A POS (08/01 0100) Rubella: 2.67 (12/15 1555)                I&O: Intake/Output      08/02 0701 - 08/03 0700 08/03 0701 - 08/04 0700   Urine (mL/kg/hr)     Blood     Total Output       Net              Physical Exam: Alert and oriented X3 Abdomen: soft, non-tender, non-distended  Fundus: firm, non-tender, U-2 Perineum: Well approximated, no significant erythema, edema, or drainage; healing well. Lochia: small Extremities: no edema, no calf pain or tenderness    Assessment: PPD #2  G2P1011/ S/P: spontaneous vaginal, ml episiotomy, periclitoral laceration IDA with compounding ABL anemia, asymptomatic Doing well - stable for discharge home   Plan: Discharge home RX's:  Ibuprofen  po Q 6 hrs prn pain #30 Refill x 0 Niferex  po BID #60 Refill x 1 Mag Oxide 200 mg po daily #30, refill x1 Routine pp visit in 6wks at [facility] Ob[facility]yn booklet given    Donette Larry, N MSN, CNM 05/08/2015, 10:31 AM

## 2015-05-08 NOTE — Discharge Summary (Signed)
Obstetric Discharge Summary Reason for Admission: rupture of membranes and [redacted] weeks gestation Prenatal Procedures: GBS positive, excessive weight gain, polyhydramnios, pelvic pressure from 28 wks, preterm ctx w/o cervical dilation from 33 wks-prn Procardia, IDA Intrapartum Procedures: spontaneous vaginal delivery, episiotomy ., GBS prophylaxis and epidural, Pitocin augmentation, mild shoulder dystocia Postpartum Procedures: none Complications-Operative and Postpartum: periclitoral laceration, ABL anemia HEMOGLOBIN  Date Value Ref Range Status  05/07/2015 6.1* 12.0 - 15.0 g/dL Final    Comment:    REPEATED TO VERIFY DELTA CHECK NOTED CRITICAL RESULT CALLED TO, READ BACK BY AND VERIFIED WITH: RN ANDINO,M @ [phone] ON 05/07/15 BY FULKS,C    HCT  Date Value Ref Range Status  05/07/2015 20.0* 36.0 - 46.0 % Final    Physical Exam:  General: alert, cooperative and no distress Lochia: appropriate Uterine Fundus: firm Incision: healing well, no significant drainage, no dehiscence, no significant erythema DVT Evaluation: No evidence of DVT seen on physical exam. Negative Homan's sign. No cords or calf tenderness. No significant calf/ankle edema.  Discharge Diagnoses: Term Pregnancy-delivered, ABL anemia  Discharge Information: Date: 05/08/2015 Activity: pelvic rest Diet: routine Medications: PNV, Ibuprofen, Iron and Mag Oxide Condition: stable Instructions: refer to practice specific booklet Discharge to: home Follow-up Information    Follow up with Lenoard Aden, MD. Schedule an appointment as soon as possible for a visit in 6 weeks.   Specialty:  Obstetrics and Gynecology   Contact information:   [address] [place] [address] [phone]       Newborn Data: Live born female  Birth Weight: 7 lb 7.4 oz (3385 g) APGAR: 8, 9 Inpatient for phototherapy  Rafia Shedden, N 05/08/2015, 10:34 AMtta

## 2015-05-08 NOTE — Lactation Note (Signed)
This note was copied from the chart of Lisa Hester. Lactation Consultation Note  Baby received formula overnight because mom's nipples and areolas are sore.  She reports pain of a 9 on the pain scale.  Baby's mouth is tight though when a gloved finger is inserted to the hard and soft palate juncture it relaxes, when he cries the sides of his tongue elevate but not the tip.  Attempted to latch him after jaw massage and tongue exercises but it was still too painful for mom.  He has received bottles and is there is a pacifier in the crib.  Introduction of these may be affecting his latch.  Though Mom has erect nipples and compressible areolas a nipple shield was introduced to try and keep Carson at the breast.  Mom applied it, Carson attached and no pain was reported except for the cramping from oxytocin release.  There was a small amount of colostrum in the shield when he detached. Feeding was encouraged on the second breast but a visitor was waiting so mom wanted to delay second side. Encouraged post pumping and follow-up with lactation if NS use is continued.  Patient Name: Lisa Nickole Adamek [id number] Date: 05/08/2015 Reason for consult: Follow-up assessment   Maternal Data Has patient been taught Hand Expression?: Yes Does the patient have breastfeeding experience prior to this delivery?: No  Feeding Feeding Type: Breast Fed  LATCH Score/Interventions Latch: Grasps breast easily, tongue down, lips flanged, rhythmical sucking.  Audible Swallowing: Spontaneous and intermittent  Type of Nipple: Everted at rest and after stimulation  Comfort (Breast/Nipple): Engorged, cracked, bleeding, large blisters, severe discomfort (Pain of a 9 before NS)  Problem noted: Severe discomfort  Hold (Positioning): Assistance needed to correctly position infant at breast and maintain latch. Intervention(s): Breastfeeding basics reviewed;Support Pillows;Skin to skin  LATCH Score: 7  Lactation Tools  Discussed/Used Tools: Nipple Shields Nipple shield size: 24   Consult Status Consult Status: Follow-up Follow-up type: Other (comment) ([facility] lactation)    Soyla Dryer 05/08/2015, 10:16 AM

## 2015-05-09 ENCOUNTER — Ambulatory Visit: Payer: Self-pay

## 2015-05-09 NOTE — Lactation Note (Signed)
This note was copied from the chart of Lisa Hester. Lactation Consultation Note  Patient Name: Lisa Hester Date: 05/09/2015   Baby on single Photo Rx throughout the night.  Mom continues to pump with a DEBP, obtaining 30 ml breast milk now.  Mom "topped off" last feeding with 40 ml of formula.  Encouraged her to pump every 2-3 hrs to hopefully have enough milk to provide primarily breast milk to baby.  Explained how breast milk will help baby stool more and help reduce bilirubin.  Talked about pump for home.  Mom has WIC, encouraged to call [facility] office about obtaining a DEBP for home use until baby is able to breast feed without need of supplement.  Talked about making an OP lactation appointment.  Informed Mom of WIC pump loaner program we have.  To call for assistance as needed.  Mom also says baby goes to breast and can latch and feed well, he is just tired.  To follow up  In am.  MSmith, [place] Lore 05/09/2015, 4:39 PM

## 2015-05-10 ENCOUNTER — Ambulatory Visit: Payer: Self-pay

## 2015-05-10 NOTE — Lactation Note (Signed)
This note was copied from the chart of Lisa Hester. Lactation Consultation Note'; Mom feeding formula when I went into room. Reports he has latched to breast some through the night but she wants to give formula to make sure he is getting enough. Reports breasts are much fuller this morning and are leaking. Assisted with DEBP after she finished feeding baby. Has WIC- wants [facility] loaner for home. Reviewed engorgement prevention and treatment. Encouraged frequent nursing or pumping, No questions at present. To call prn  Patient Name: Lisa Hester [id number] Date: 05/10/2015 Reason for consult: Follow-up assessment;Late preterm infant   Maternal Data    Feeding    LATCH Score/Interventions          Comfort (Breast/Nipple): Filling, red/small blisters or bruises, mild/mod discomfort  Problem noted: Filling Interventions (Filling): Frequent nursing;Massage Interventions (Severe discomfort): Double electric pum        Lactation Tools Discussed/Used WIC Program: Yes   Consult Status Consult Status: Complete    Pamelia Hoit 05/10/2015, 8:32 AM

## 2015-07-05 ENCOUNTER — Emergency Department (HOSPITAL_COMMUNITY)
Admission: EM | Admit: 2015-07-05 | Discharge: 2015-07-05 | Disposition: A | Discharge status: Home / Self Care | Payer: Medicaid Other | Attending: Emergency Medicine | Admitting: Emergency Medicine

## 2015-07-05 ENCOUNTER — Encounter (HOSPITAL_COMMUNITY): Payer: Self-pay | Admitting: Emergency Medicine

## 2015-07-05 DIAGNOSIS — Z8619 Personal history of other infectious and parasitic diseases: Secondary | ICD-10-CM | POA: Insufficient documentation

## 2015-07-05 DIAGNOSIS — Z791 Long term (current) use of non-steroidal anti-inflammatories (NSAID): Secondary | ICD-10-CM | POA: Diagnosis not present

## 2015-07-05 DIAGNOSIS — H66002 Acute suppurative otitis media without spontaneous rupture of ear drum, left ear: Secondary | ICD-10-CM

## 2015-07-05 DIAGNOSIS — H9202 Otalgia, left ear: Secondary | ICD-10-CM | POA: Diagnosis present

## 2015-07-05 DIAGNOSIS — Z79899 Other long term (current) drug therapy: Secondary | ICD-10-CM | POA: Insufficient documentation

## 2015-07-05 DIAGNOSIS — J029 Acute pharyngitis, unspecified: Secondary | ICD-10-CM

## 2015-07-05 DIAGNOSIS — Z87891 Personal history of nicotine dependence: Secondary | ICD-10-CM | POA: Insufficient documentation

## 2015-07-05 DIAGNOSIS — D500 Iron deficiency anemia secondary to blood loss (chronic): Secondary | ICD-10-CM | POA: Insufficient documentation

## 2015-07-05 DIAGNOSIS — R51 Headache: Secondary | ICD-10-CM | POA: Diagnosis not present

## 2015-07-05 MED ORDER — AMOXICILLIN 500 MG PO CAPS: 500.0000 mg | ORAL_CAPSULE | Freq: Three times a day (TID) | ORAL | Status: AC

## 2015-07-05 NOTE — ED Notes (Signed)
Pt c/o sore throat that has been hurting all week but got worse last night. Pt states that she has bilat ear pain as well.  Pt denies fever

## 2015-07-05 NOTE — ED Provider Notes (Signed)
CSN: [id number]     Arrival date & time 07/05/15  1016 History   First MD Initiated Contact with Patient 07/05/15 1020     Chief Complaint  Patient presents with  . Sore Throat  . Otalgia     (Consider location/radiation/quality/duration/timing/severity/associated sxs/prior Treatment) The history is provided by the patient and medical records.    This is a 19 y.o. F with hx of headaches, Fe+ deficiency anemia, presenting to the ED for sore throat and bilateral ear pain.  States initially sore throat began approx 1 week ago and began having bilateral ear pain yesterday.  States painful to eat/drink but not difficulty swallowing.  Denies fever,chills, sweats.  No cough, nasal congestion, rhinorrhea, chest pain, or SOB.  No known sick contacts.  No intervention tried PTA.  VSS.  Past Medical History  Diagnosis Date  . Headache   . Anemia   . Former smoker   . Hx of gonorrhea 2013    treated  . Wisdom teeth removed   . IDA (iron deficiency anemia) 05/07/2015  . Acute blood loss anemia 05/07/2015   Past Surgical History  Procedure Laterality Date  . Wisdom tooth extraction     No family history on file. Social History  Substance Use Topics  . Smoking status: Former Smoker    Types: Cigars  . Smokeless tobacco: Never Used  . Alcohol Use: No   OB History    Gravida Para Term Preterm AB TAB SAB Ectopic Multiple Living   0 1     Review of Systems  HENT: Positive for ear pain and sore throat.   All other systems reviewed and are negative.     Allergies  Ginger  Home Medications   Prior to Admission medications   Medication Sig Start Date End Date Taking? Authorizing [place]  ibuprofen (ADVIL,MOTRIN) 600 MG tablet Take 1 tablet (600 mg total) by mouth every 6 (six) hours. 05/08/15   Lawernce Pitts, CNM  iron polysaccharides (NIFEREX) 150 MG capsule Take 1 capsule (150 mg total) by mouth 2 (two) times daily. 05/08/15   Lawernce Pitts, CNM  magnesium oxide  (MAG-OX) 400 (241.3 MG) MG tablet Take 0.5 tablets (200 mg total) by mouth daily. 05/08/15   Lawernce Pitts, CNM  Prenatal Vit-Fe Fumarate-FA (PRENATAL MULTIVITAMIN) TABS tablet Take 1 tablet by mouth daily.     Historical Sakari Alkhatib, MD   BP 112/79 mmHg  Pulse 97  Temp(Src) 98.4 F (36.9 C) (Oral)  Resp 19  SpO2 100%  LMP 06/19/2015   Physical Exam  Constitutional: She is oriented to person, place, and time. She appears well-developed and well-nourished.  HENT:  Head: Normocephalic and atraumatic.  Right Ear: Tympanic membrane, external ear and ear canal normal.  Left Ear: There is tenderness. No drainage. No mastoid tenderness. Tympanic membrane is erythematous. A middle ear effusion is present. No hemotympanum.  Nose: Nose normal.  Mouth/Throat: Uvula is midline and mucous membranes are normal. No trismus in the jaw. Posterior oropharyngeal erythema present. No oropharyngeal exudate, posterior oropharyngeal edema or tonsillar abscesses.  Tonsils 1+ bilaterally without exudate; uvula midline without peritonsillar abscess; handling secretions appropriately; no difficulty swallowing or speaking Left OM without perforation, no mastoid tenderness Right ear normal  Eyes: Conjunctivae and EOM are normal. Pupils are equal, round, and reactive to light.  Neck: Normal range of motion.  Cardiovascular: Normal rate, regular rhythm and normal heart sounds.   Pulmonary/Chest: Effort normal and  breath sounds normal.  Abdominal: Soft. Bowel sounds are normal.  Musculoskeletal: Normal range of motion.  Lymphadenopathy:    She has no cervical adenopathy.  Neurological: She is alert and oriented to person, place, and time.  Skin: Skin is warm and dry.  Psychiatric: She has a normal mood and affect.  Nursing note and vitals reviewed.   ED Course  Procedures (including critical care time) Labs Review Labs Reviewed - No data to display  Imaging Review No results found.    EKG  Interpretation None      MDM   Final diagnoses:  Acute suppurative otitis media of left ear without spontaneous rupture of tympanic membrane, recurrence not specified  Sore throat   18 y.o. F with sore throat and ear pain.  Patient afebrile, non-toxic.  Exam findings with evidence of right OM and oropharyngeal erythema.  Will start on amoxicillin for coverage of OM and strep pharyngitis.  Discussed plan with patient, he/she acknowledged understanding and agreed with plan of care.  Return precautions given for new or worsening symptoms.  Garlon Hatchet, PA-C 07/05/15 1035  Alvira Monday, MD 07/08/15 [phone]

## 2015-07-05 NOTE — Discharge Instructions (Signed)
Take the prescribed medication as directed. °Return to the ED for new or worsening symptoms. ° °

## 2015-11-07 ENCOUNTER — Encounter (HOSPITAL_COMMUNITY): Payer: Self-pay

## 2015-11-07 ENCOUNTER — Inpatient Hospital Stay (HOSPITAL_COMMUNITY)
Admission: AD | Admit: 2015-11-07 | Discharge: 2015-11-07 | Disposition: A | Discharge status: Home / Self Care | Payer: Medicaid Other | Source: Ambulatory Visit | Attending: Obstetrics and Gynecology | Admitting: Obstetrics and Gynecology

## 2015-11-07 DIAGNOSIS — Z87891 Personal history of nicotine dependence: Secondary | ICD-10-CM | POA: Insufficient documentation

## 2015-11-07 DIAGNOSIS — N946 Dysmenorrhea, unspecified: Secondary | ICD-10-CM | POA: Insufficient documentation

## 2015-11-07 DIAGNOSIS — N920 Excessive and frequent menstruation with regular cycle: Secondary | ICD-10-CM | POA: Insufficient documentation

## 2015-11-07 LAB — URINALYSIS, ROUTINE W REFLEX MICROSCOPIC
Bilirubin Urine: NEGATIVE
GLUCOSE, UA: NEGATIVE mg/dL
Ketones, ur: NEGATIVE mg/dL
LEUKOCYTES UA: NEGATIVE
Nitrite: NEGATIVE
PH: 6 (ref 5.0–8.0)
Protein, ur: NEGATIVE mg/dL
Specific Gravity, Urine: 1.02 (ref 1.005–1.030)

## 2015-11-07 LAB — URINE MICROSCOPIC-ADD ON

## 2015-11-07 LAB — POCT PREGNANCY, URINE: Preg Test, Ur: NEGATIVE

## 2015-11-07 MED ORDER — ONDANSETRON 4 MG PO TBDP
4.0000 mg | ORAL_TABLET | Freq: Once | ORAL | Status: AC
  Administered 2015-11-07: 4 mg via ORAL
  Filled 2015-11-07: qty 1

## 2015-11-07 MED ORDER — ONDANSETRON 4 MG PO TBDP: 4.0000 mg | ORAL_TABLET | Freq: Once | ORAL | Status: AC

## 2015-11-07 MED ORDER — TRAMADOL HCL 50 MG PO TABS: 50.0000 mg | ORAL_TABLET | Freq: Four times a day (QID) | ORAL | Status: AC | PRN

## 2015-11-07 MED ORDER — TRAMADOL HCL 50 MG PO TABS
50.0000 mg | ORAL_TABLET | Freq: Once | ORAL | Status: AC
  Administered 2015-11-07: 50 mg via ORAL
  Filled 2015-11-07: qty 1

## 2015-11-07 NOTE — MAU Provider Note (Signed)
History     CSN: [id number]  Arrival date and time: 11/07/15 1126 Provider notifed: 1205 Provider on unit: 1245 Provider oat bedside: 1245     Chief Complaint  Patient presents with  . Vaginal Bleeding  . Abdominal Cramping   HPI  Ms. Lisa Hester is a 20 yo [id number] female presenting with complaints of "extremely bad menstrual cramps".  Her LMP started 11/06/2015.  She reports a heavy flow and passing clots.  She had a baby 6 months ago. She has had about 3 normal menstrual cycles since then; until this cycle. She reports that she has changed 3 pads total all day today. She took Ibuprofen 800 mg yesterday , but has taken nothing today d/t "feeling like going to throw up". Her primary [facility] provider is Dr. Billy Coast, however, she states that she "does not go to [facility] anymore since delivery" and she does not have another GYN provider yet.  Past Medical History  Diagnosis Date  . Headache   . Anemia   . Former smoker   . Hx of gonorrhea 2013    treated  . Wisdom teeth removed   . IDA (iron deficiency anemia) 05/07/2015  . Acute blood loss anemia 05/07/2015    Past Surgical History  Procedure Laterality Date  . Wisdom tooth extraction      History reviewed. No pertinent family history.  Social History  Substance Use Topics  . Smoking status: Former Smoker    Types: Cigars  . Smokeless tobacco: Never Used  . Alcohol Use: No    Allergies:  Allergies  Allergen Reactions  . Ginger Hives    Prescriptions prior to admission  Medication Sig Dispense Refill Last Dose  . ibuprofen (ADVIL,MOTRIN) 600 MG tablet Take 1 tablet (600 mg total) by mouth every 6 (six) hours. 30 tablet 0 11/06/2015 at Unknown time  . amoxicillin (AMOXIL) 500 MG capsule Take 1 capsule (500 mg total) by mouth 3 (three) times daily. (Patient not taking: Reported on 11/07/2015) 30 capsule 0   . iron polysaccharides (NIFEREX) 150 MG capsule Take 1 capsule (150 mg total) by mouth 2 (two) times daily. (Patient not  taking: Reported on 11/07/2015) 60 capsule 1   . magnesium oxide (MAG-OX) 400 (241.3 MG) MG tablet Take 0.5 tablets (200 mg total) by mouth daily. (Patient not taking: Reported on 11/07/2015) 30 tablet 1     Review of Systems  Constitutional: Negative.   HENT: Negative.   Eyes: Negative.   Respiratory: Negative.   Cardiovascular: Negative.   Gastrointestinal: Positive for nausea. Negative for vomiting.  Genitourinary:       "extreme" menstrual cramps; heavy vaginal bleeding with clots; has changed 3 pads total for today  Musculoskeletal: Negative.   Skin: Negative.   Neurological: Negative.   Endo/Heme/Allergies: Negative.   Psychiatric/Behavioral: Negative.    Physical Exam   Blood pressure 98/67, pulse 85, temperature 98.2 F (36.8 C), resp. rate 18, last menstrual period 11/06/2015, not currently breastfeeding.  Physical Exam  Constitutional: She is oriented to person, place, and time. She appears well-developed and well-nourished.  HENT:  Head: Normocephalic.  GI: Soft. Bowel sounds are normal.  Genitourinary:  Speculum: small amount of BRB in vaginal vault - removed with 2 large swabs; small amount of clear mucous from cervical os observed; pt REFUSED bimanual exam; pt tolerated well, but tearful at end of the exam  Neurological: She is alert and oriented to person, place, and time.  Skin: Skin is warm and dry.  Psychiatric:  She has a normal mood and affect. Her behavior is normal. Judgment and thought content normal.    MAU Course  Procedures SSE Ultram 50 mg po x 1 dose prior to d/c home Zofran 4 mg ODT x 1 dose prior to d/c home Assessment and Plan  Dysmenorrhea Menorrhagia  Discharge home F/U with Dr. Billy Coast in the office for further complaints Call [facility] office for any further questions, problems or concerns  Dr. Billy Coast notified of assessment and plan  Kenard Gower MSN, CNM 11/07/2015, 12:45 PM

## 2015-11-07 NOTE — MAU Note (Signed)
Notified provider that patient is here.   

## 2015-11-07 NOTE — Discharge Instructions (Signed)
Menorrhagia Menorrhagia is when your menstrual periods are heavy or last longer than usual.  HOME CARE  Only take medicine as told by your doctor.  Take any iron pills as told by your doctor. Heavy bleeding may cause low levels of iron in your body.  Do not take aspirin 1 week before or during your period. Aspirin can make the bleeding worse.  Lie down for a while if you change your tampon or pad more than once in 2 hours. This may help lessen the bleeding.  Eat a healthy diet and foods with iron. These foods include leafy green vegetables, meat, liver, eggs, and whole grain breads and cereals.  Do not try to lose weight. Wait until the heavy bleeding has stopped and your iron level is normal. GET HELP IF:  You soak through a pad or tampon every 1 or 2 hours, and this happens every time you have a period.  You need to use pads and tampons at the same time because you are bleeding so much.  You need to change your pad or tampon during the night.  You have a period that lasts for more than 8 days.  You pass clots bigger than 1 inch (2.5 cm) wide.  You have irregular periods that happen more or less often than once a month.  You feel dizzy or pass out (faint).  You feel very weak or tired.  You feel short of breath or feel your heart is beating too fast when you exercise.  You feel sick to your stomach (nausea) and you throw up (vomit) while you are taking your medicine.   You have watery poop (diarrhea) while you are taking your medicine.  You have any problems that may be related to the medicine you are taking.  GET HELP RIGHT AWAY IF:  You soak through 4 or more pads or tampons in 2 hours.  You have any bleeding while you are pregnant. MAKE SURE YOU:   Understand these instructions.  Will watch your condition.  Will get help right away if you are not doing well or get worse.   This information is not intended to replace advice given to you by your health care  provider. Make sure you discuss any questions you have with your health care provider.   Document Released: 06/30/2008 Document Revised: 05/24/2013 Document Reviewed: 03/23/2013 Elsevier Interactive Patient Education 2016 [employer] Dysmenorrhea Dysmenorrhea is pain during a menstrual period. You will have pain in the lower belly (abdomen). The pain is caused by the tightening (contracting) of the muscles of the uterus. The pain can be minor or severe. Headache, feeling sick to your stomach (nausea), throwing up (vomiting), or low back pain may occur with this condition. HOME CARE  Only take medicine as told by your doctor.  Place a heating pad or hot water bottle on your lower back or belly. Do not sleep with a heating pad.  Exercise may help lessen the pain.  Massage the lower back or belly.  Stop smoking.  Avoid alcohol and caffeine. GET HELP IF:   Your pain does not get better with medicine.  You have pain during sex.  Your pain gets worse while taking pain medicine.  Your period bleeding is heavier than normal.  You keep feeling sick to your stomach or keep throwing up. GET HELP RIGHT AWAY IF: You pass out (faint).   This information is not intended to replace advice given to you by your health care provider. Make  Your period bleeding is heavier than normal.  · You keep feeling sick to your stomach or keep throwing up.  GET HELP RIGHT AWAY IF:  You pass out (faint).     This information is not intended to replace advice given to you by your health care provider. Make sure you discuss any questions you have with your health care provider.     Document Released: 12/18/2008 Document Revised: 09/26/2013 Document Reviewed: 03/09/2013  Elsevier Interactive Patient Education ©2016 [employer]

## 2015-11-07 NOTE — MAU Note (Signed)
Pt presents to MAU with complaints of lower abdominal cramping that started last night. Started her cycle yesterday.

## 2016-07-08 ENCOUNTER — Ambulatory Visit (INDEPENDENT_AMBULATORY_CARE_PROVIDER_SITE_OTHER): Payer: Self-pay | Admitting: *Deleted

## 2016-07-08 DIAGNOSIS — Z3200 Encounter for pregnancy test, result unknown: Secondary | ICD-10-CM

## 2016-07-08 DIAGNOSIS — Z3202 Encounter for pregnancy test, result negative: Secondary | ICD-10-CM

## 2016-07-08 LAB — POCT PREGNANCY, URINE: PREG TEST UR: NEGATIVE

## 2016-07-08 NOTE — Progress Notes (Signed)
Pt reports LMP was beginning of September. She is having spotting but so far, no real period.  UPT today is negative.  Pt states that she uses condoms for birth control and does not "believe" in birth control. Pt was advised to repeat pregnancy test in 1-2 weeks if she does not get a full period and cis concerned that she may be pregnant. She voiced understanding.

## 2016-09-11 IMAGING — US US OB TRANSVAGINAL
1 series · 14 of 28 positions shown · non-contrast
Comparison: 09/25/2014

CLINICAL DATA: Followup viability.

EXAM:
TRANSVAGINAL OB ULTRASOUND
TECHNIQUE: Transvaginal ultrasound was performed for complete evaluation of the
gestation as well as the maternal uterus, adnexal regions, and
pelvic cul-de-sac.

[Series 1: us ob transvaginal · 32 acquisitions, 14 frames shown]
[im 2/32]
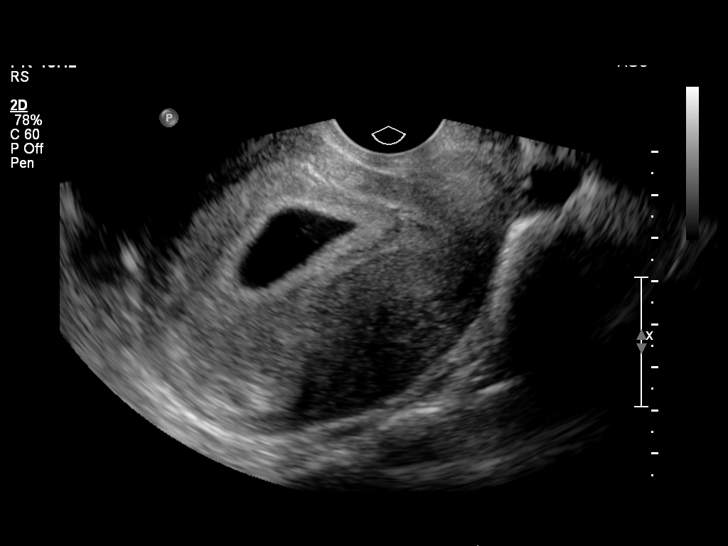
[im 4/32]
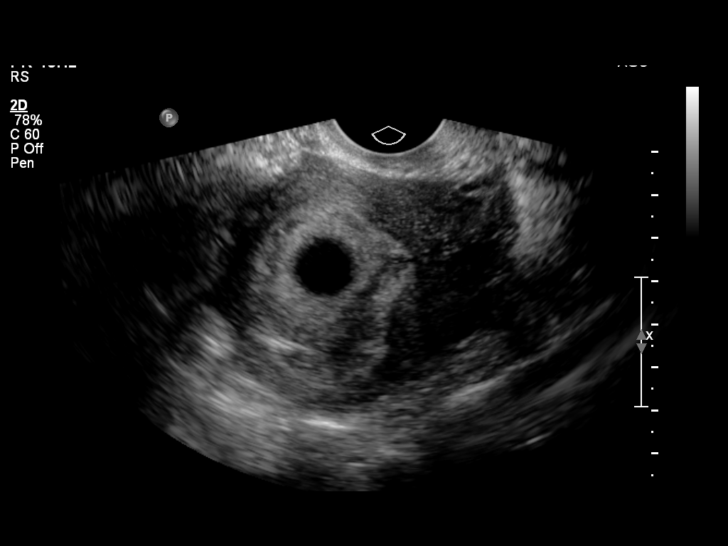
[im 6/32]
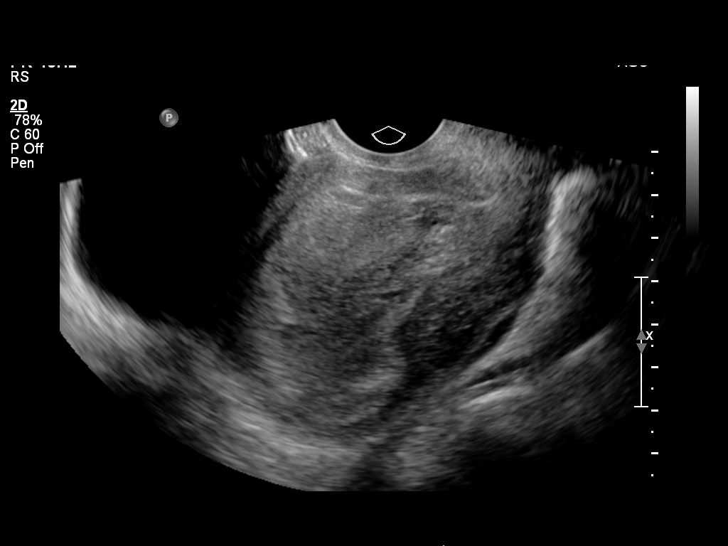
[im 9/32]
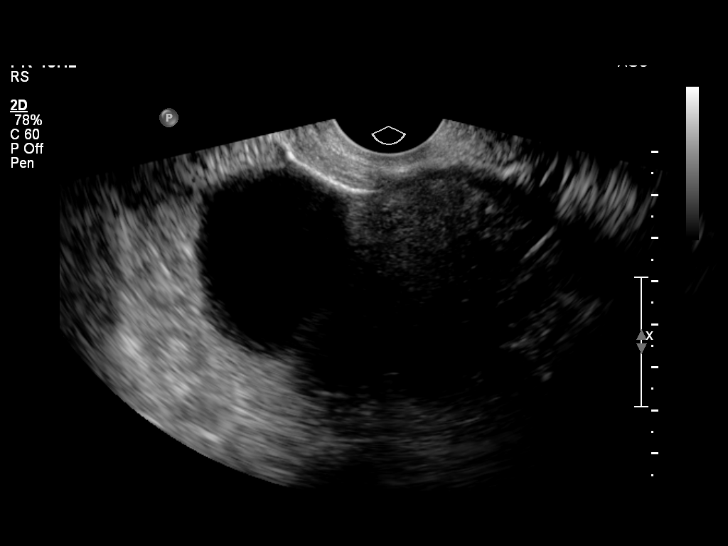
[im 11/32]
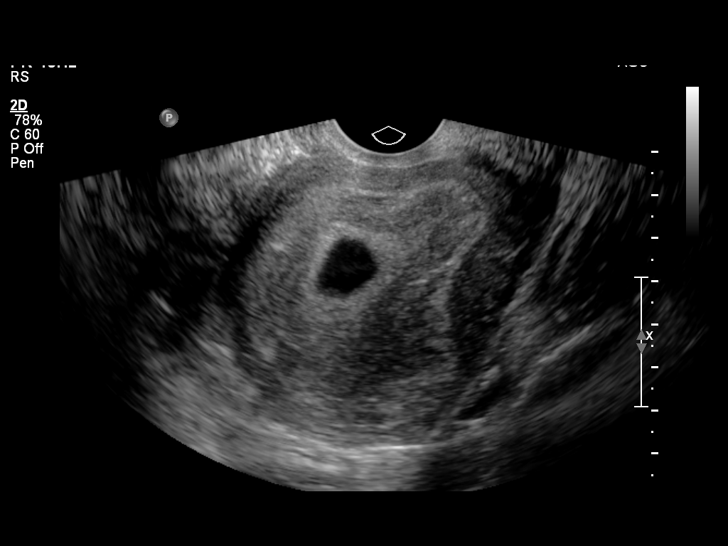
[im 13/32]
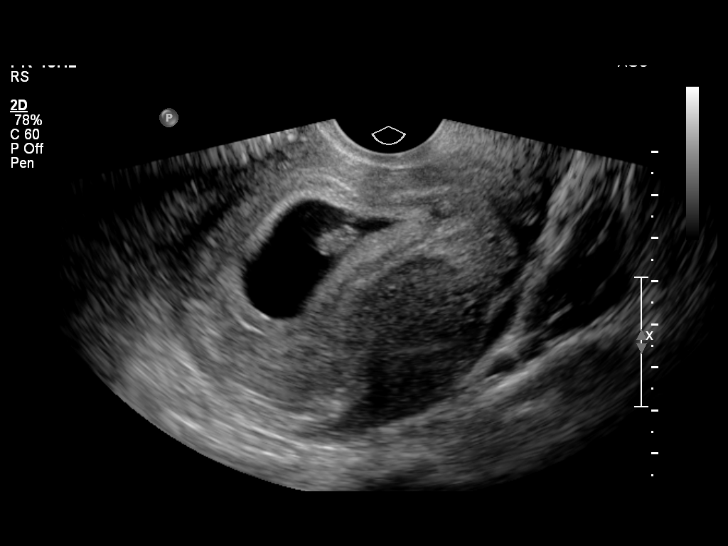
[im 15/32]
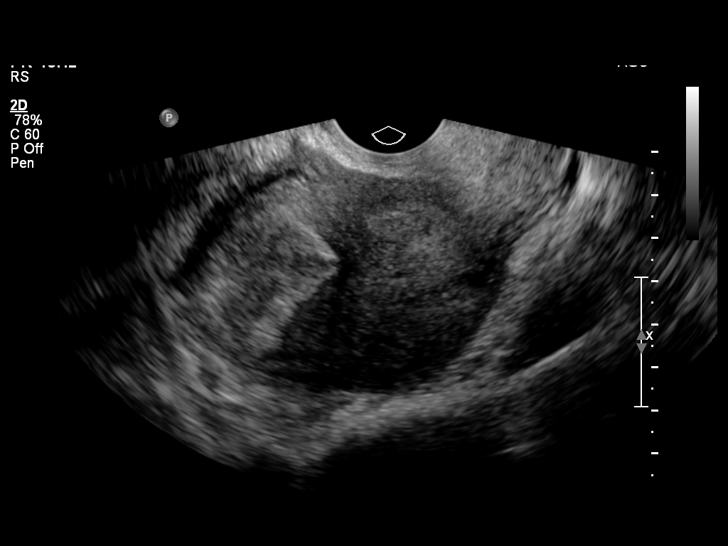
[im 18/32]
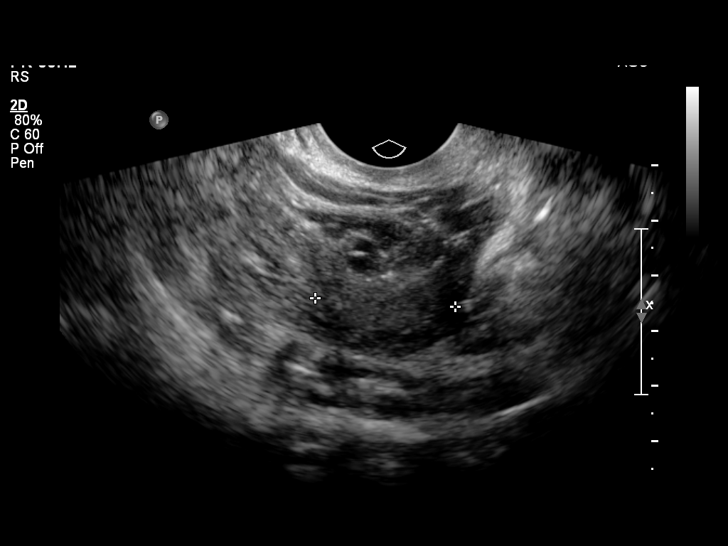
[im 20/32]
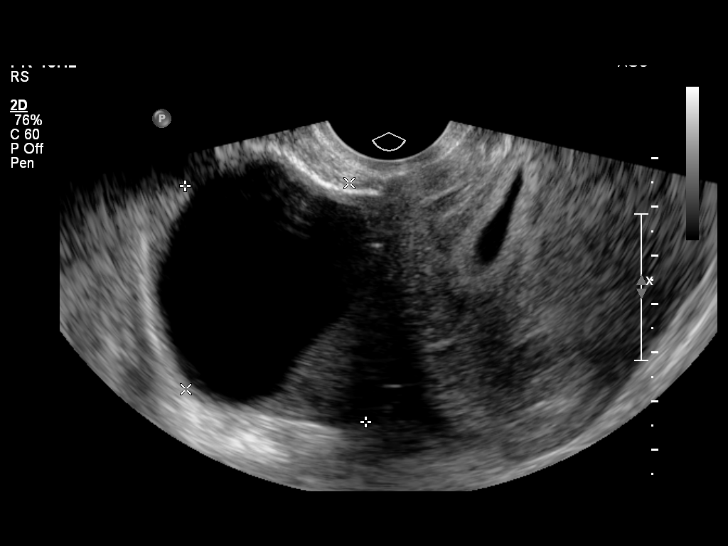
[im 22/32]
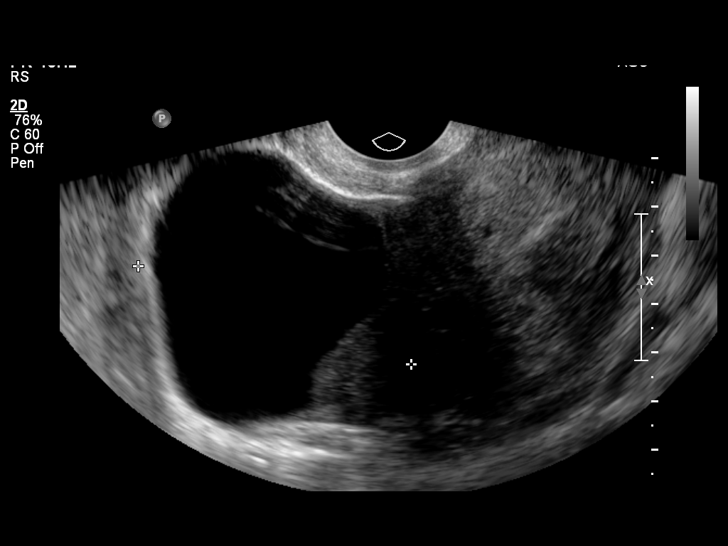
[im 25/32]
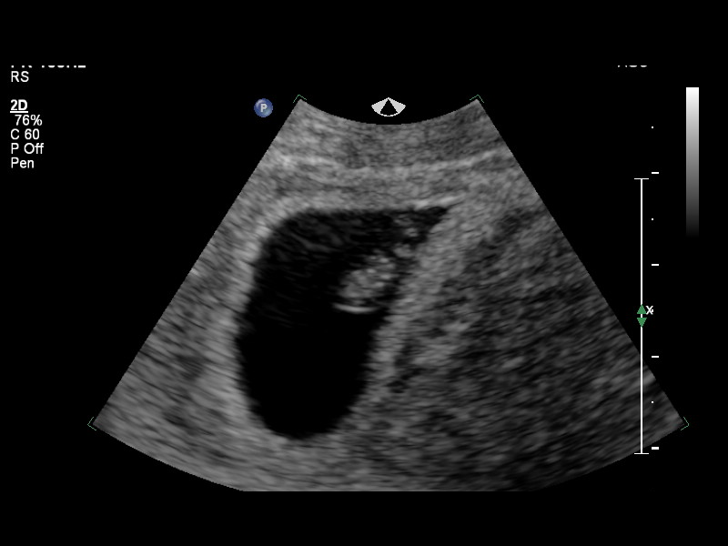
[im 27/32]
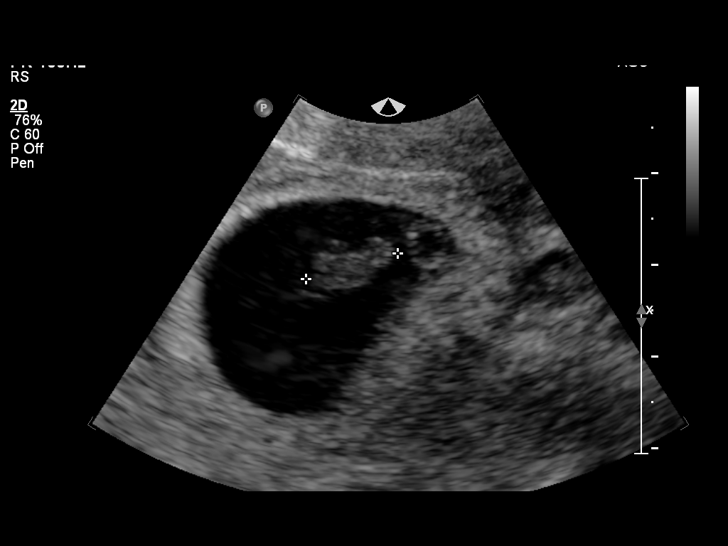
[im 29/32]
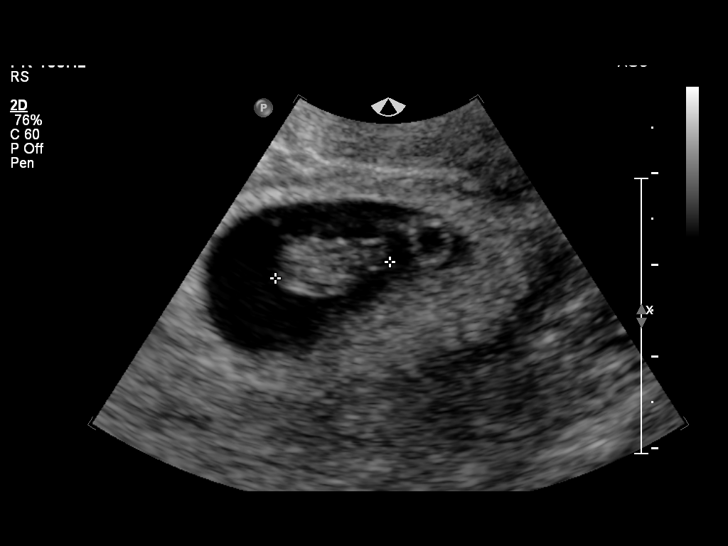
[im 32/32]
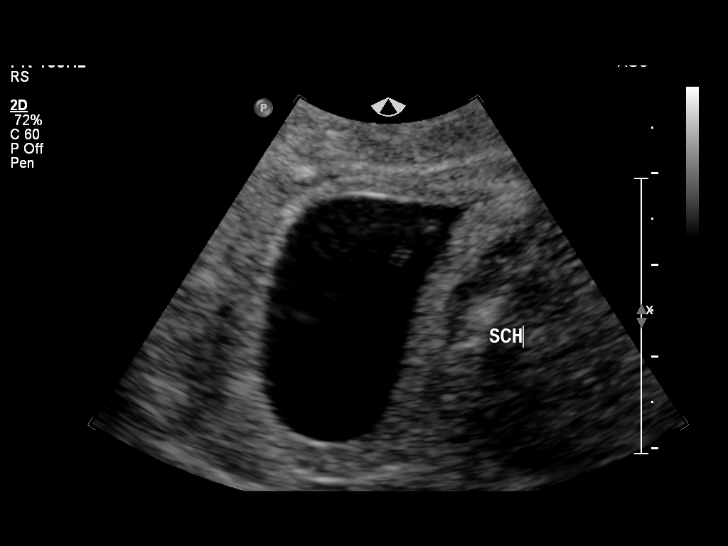

[14 of 28 positions shown; findings below may reference images not displayed]

FINDINGS: Intrauterine gestational sac: Visualized/normal in shape.

Yolk sac:  Visualized

Embryo:  Visualized

Cardiac Activity: Visualized

Heart Rate: 165 bpm

MSD:   mm    w     d

CRL:   11.9  mm   7 w 3 d                  US EDC: 05/25/2015

Maternal uterus/adnexae: Small subchorionic hemorrhage. Simple
appearing left ovarian cyst again noted measuring up to 6.1 cm. No
free fluid.
IMPRESSION: 7 week 3 day intrauterine pregnancy with fetal heart rate 165 beats
per min.

Small subchorionic hemorrhage.

6 cm simple appearing left ovarian cyst.

## 2016-09-19 ENCOUNTER — Emergency Department (HOSPITAL_COMMUNITY)
Admission: EM | Admit: 2016-09-19 | Discharge: 2016-09-20 | Disposition: A | Discharge status: Home / Self Care | Payer: Medicaid Other | Attending: Emergency Medicine | Admitting: Emergency Medicine

## 2016-09-19 ENCOUNTER — Encounter (HOSPITAL_COMMUNITY): Payer: Self-pay | Admitting: Nurse Practitioner

## 2016-09-19 DIAGNOSIS — Z5321 Procedure and treatment not carried out due to patient leaving prior to being seen by health care provider: Secondary | ICD-10-CM | POA: Diagnosis not present

## 2016-09-19 DIAGNOSIS — M79602 Pain in left arm: Secondary | ICD-10-CM | POA: Insufficient documentation

## 2016-09-19 NOTE — ED Triage Notes (Signed)
Pt is c/o left arm pain onset 2 days ago, says that she suspects it is muscle pain.

## 2016-09-20 NOTE — ED Notes (Signed)
Pt not found in triage room. Pt not in waiting room either.
